# Patient Record
Sex: Male | Born: 1955 | Race: White | Hispanic: Yes | Marital: Married | Smoking: Current every day smoker
Health system: Southern US, Community
[De-identification: ages and names within clinical notes are randomized; demographics above are authoritative.]

## PROBLEM LIST (undated history)

## (undated) DIAGNOSIS — I10 Essential (primary) hypertension: Secondary | ICD-10-CM

## (undated) DIAGNOSIS — N433 Hydrocele, unspecified: Secondary | ICD-10-CM

## (undated) DIAGNOSIS — E785 Hyperlipidemia, unspecified: Secondary | ICD-10-CM

## (undated) DIAGNOSIS — E119 Type 2 diabetes mellitus without complications: Secondary | ICD-10-CM

## (undated) DIAGNOSIS — Z9189 Other specified personal risk factors, not elsewhere classified: Secondary | ICD-10-CM

---

## 2006-05-26 ENCOUNTER — Ambulatory Visit: Payer: Self-pay | Admitting: Internal Medicine

## 2006-05-26 LAB — CONVERTED CEMR LAB
ALT: 35 units/L (ref 0–40)
AST: 22 units/L (ref 0–37)
BUN: 8 mg/dL (ref 6–23)
CO2: 29 meq/L (ref 19–32)
Creatinine, Ser: 0.8 mg/dL (ref 0.4–1.5)
GFR calc Af Amer: 132 mL/min
GFR calc non Af Amer: 109 mL/min
Hgb A1c MFr Bld: 12.5 % — ABNORMAL HIGH (ref 4.6–6.0)
PSA: 0.82 ng/mL (ref 0.10–4.00)
Potassium: 4.3 meq/L (ref 3.5–5.1)
RBC: 5.45 M/uL (ref 4.22–5.81)
Sodium: 132 meq/L — ABNORMAL LOW (ref 135–145)
TSH: 1.2 microintl units/mL (ref 0.35–5.50)
Total Bilirubin: 1.2 mg/dL (ref 0.3–1.2)
WBC: 5.8 10*3/uL (ref 4.5–10.5)

## 2006-06-02 ENCOUNTER — Ambulatory Visit: Payer: Self-pay | Admitting: Internal Medicine

## 2006-06-19 ENCOUNTER — Ambulatory Visit: Payer: Self-pay | Admitting: Internal Medicine

## 2006-07-14 ENCOUNTER — Ambulatory Visit: Payer: Self-pay | Admitting: Internal Medicine

## 2006-07-16 ENCOUNTER — Ambulatory Visit: Payer: Self-pay | Admitting: *Deleted

## 2006-07-30 ENCOUNTER — Ambulatory Visit: Payer: Self-pay | Admitting: Internal Medicine

## 2006-08-14 ENCOUNTER — Ambulatory Visit: Payer: Self-pay | Admitting: Internal Medicine

## 2007-01-21 ENCOUNTER — Telehealth (INDEPENDENT_AMBULATORY_CARE_PROVIDER_SITE_OTHER): Payer: Self-pay | Admitting: *Deleted

## 2007-01-25 ENCOUNTER — Encounter (INDEPENDENT_AMBULATORY_CARE_PROVIDER_SITE_OTHER): Payer: Self-pay | Admitting: Nurse Practitioner

## 2007-01-25 DIAGNOSIS — N476 Balanoposthitis: Secondary | ICD-10-CM

## 2007-01-25 DIAGNOSIS — R809 Proteinuria, unspecified: Secondary | ICD-10-CM | POA: Insufficient documentation

## 2007-01-27 ENCOUNTER — Encounter (INDEPENDENT_AMBULATORY_CARE_PROVIDER_SITE_OTHER): Payer: Self-pay | Admitting: *Deleted

## 2007-01-29 ENCOUNTER — Ambulatory Visit: Payer: Self-pay | Admitting: Internal Medicine

## 2007-01-29 DIAGNOSIS — R3911 Hesitancy of micturition: Secondary | ICD-10-CM | POA: Insufficient documentation

## 2007-01-29 DIAGNOSIS — M25569 Pain in unspecified knee: Secondary | ICD-10-CM | POA: Insufficient documentation

## 2007-01-29 DIAGNOSIS — E119 Type 2 diabetes mellitus without complications: Secondary | ICD-10-CM | POA: Insufficient documentation

## 2007-01-29 DIAGNOSIS — G4700 Insomnia, unspecified: Secondary | ICD-10-CM | POA: Insufficient documentation

## 2007-01-29 LAB — CONVERTED CEMR LAB: Hgb A1c MFr Bld: 5.8 %

## 2007-02-16 ENCOUNTER — Ambulatory Visit: Payer: Self-pay | Admitting: Internal Medicine

## 2007-02-16 DIAGNOSIS — M76899 Other specified enthesopathies of unspecified lower limb, excluding foot: Secondary | ICD-10-CM | POA: Insufficient documentation

## 2007-03-16 ENCOUNTER — Ambulatory Visit: Payer: Self-pay | Admitting: Internal Medicine

## 2007-03-16 ENCOUNTER — Ambulatory Visit (HOSPITAL_COMMUNITY): Admission: RE | Admit: 2007-03-16 | Discharge: 2007-03-16 | Payer: Self-pay | Admitting: Internal Medicine

## 2007-03-16 DIAGNOSIS — IMO0002 Reserved for concepts with insufficient information to code with codable children: Secondary | ICD-10-CM

## 2007-03-16 LAB — CONVERTED CEMR LAB: Blood Glucose, Fingerstick: 118

## 2007-03-19 ENCOUNTER — Telehealth (INDEPENDENT_AMBULATORY_CARE_PROVIDER_SITE_OTHER): Payer: Self-pay | Admitting: Internal Medicine

## 2007-03-21 ENCOUNTER — Encounter (INDEPENDENT_AMBULATORY_CARE_PROVIDER_SITE_OTHER): Payer: Self-pay | Admitting: Internal Medicine

## 2007-03-22 ENCOUNTER — Encounter (INDEPENDENT_AMBULATORY_CARE_PROVIDER_SITE_OTHER): Payer: Self-pay | Admitting: Internal Medicine

## 2007-03-22 ENCOUNTER — Telehealth (INDEPENDENT_AMBULATORY_CARE_PROVIDER_SITE_OTHER): Payer: Self-pay | Admitting: Internal Medicine

## 2007-04-06 ENCOUNTER — Ambulatory Visit: Payer: Self-pay | Admitting: Internal Medicine

## 2007-04-12 ENCOUNTER — Encounter (INDEPENDENT_AMBULATORY_CARE_PROVIDER_SITE_OTHER): Payer: Self-pay | Admitting: Internal Medicine

## 2007-04-13 ENCOUNTER — Encounter: Admission: RE | Admit: 2007-04-13 | Discharge: 2007-04-29 | Payer: Self-pay | Admitting: Internal Medicine

## 2007-04-29 ENCOUNTER — Encounter (INDEPENDENT_AMBULATORY_CARE_PROVIDER_SITE_OTHER): Payer: Self-pay | Admitting: Internal Medicine

## 2007-05-18 ENCOUNTER — Ambulatory Visit: Payer: Self-pay | Admitting: Internal Medicine

## 2007-05-18 DIAGNOSIS — N401 Enlarged prostate with lower urinary tract symptoms: Secondary | ICD-10-CM | POA: Insufficient documentation

## 2007-05-18 DIAGNOSIS — J069 Acute upper respiratory infection, unspecified: Secondary | ICD-10-CM | POA: Insufficient documentation

## 2007-08-10 ENCOUNTER — Ambulatory Visit: Payer: Self-pay | Admitting: Family Medicine

## 2007-08-10 LAB — CONVERTED CEMR LAB: Hgb A1c MFr Bld: 6 %

## 2007-09-16 ENCOUNTER — Ambulatory Visit: Payer: Self-pay | Admitting: Internal Medicine

## 2007-09-16 ENCOUNTER — Encounter (INDEPENDENT_AMBULATORY_CARE_PROVIDER_SITE_OTHER): Payer: Self-pay | Admitting: Internal Medicine

## 2007-09-16 DIAGNOSIS — F528 Other sexual dysfunction not due to a substance or known physiological condition: Secondary | ICD-10-CM | POA: Insufficient documentation

## 2007-09-16 DIAGNOSIS — E785 Hyperlipidemia, unspecified: Secondary | ICD-10-CM | POA: Insufficient documentation

## 2007-09-16 LAB — CONVERTED CEMR LAB: Blood Glucose, Fingerstick: 94

## 2007-11-17 ENCOUNTER — Ambulatory Visit: Payer: Self-pay | Admitting: Nurse Practitioner

## 2007-11-17 DIAGNOSIS — M940 Chondrocostal junction syndrome [Tietze]: Secondary | ICD-10-CM | POA: Insufficient documentation

## 2008-01-10 ENCOUNTER — Telehealth (INDEPENDENT_AMBULATORY_CARE_PROVIDER_SITE_OTHER): Payer: Self-pay | Admitting: Internal Medicine

## 2008-01-19 ENCOUNTER — Ambulatory Visit: Payer: Self-pay | Admitting: Internal Medicine

## 2008-01-19 LAB — CONVERTED CEMR LAB
ALT: 16 units/L (ref 0–53)
Albumin: 4.5 g/dL (ref 3.5–5.2)
Bilirubin, Direct: 0.1 mg/dL (ref 0.0–0.3)
HDL: 41 mg/dL (ref >39)
LDL Cholesterol: 90 mg/dL (ref 0–99)
Total Protein: 7.6 g/dL (ref 6.0–8.3)
Triglycerides: 127 mg/dL (ref <150)

## 2008-02-03 LAB — CONVERTED CEMR LAB
LDL Cholesterol: 86 mg/dL (ref 0–99)
Microalb, Ur: 0.55 mg/dL (ref 0.00–1.89)

## 2008-02-09 ENCOUNTER — Encounter (INDEPENDENT_AMBULATORY_CARE_PROVIDER_SITE_OTHER): Payer: Self-pay | Admitting: *Deleted

## 2008-02-22 ENCOUNTER — Ambulatory Visit: Payer: Self-pay | Admitting: Internal Medicine

## 2008-02-22 LAB — CONVERTED CEMR LAB: Blood Glucose, Fingerstick: 205

## 2008-04-10 ENCOUNTER — Telehealth (INDEPENDENT_AMBULATORY_CARE_PROVIDER_SITE_OTHER): Payer: Self-pay | Admitting: *Deleted

## 2008-09-19 ENCOUNTER — Telehealth (INDEPENDENT_AMBULATORY_CARE_PROVIDER_SITE_OTHER): Payer: Self-pay | Admitting: Internal Medicine

## 2008-10-24 ENCOUNTER — Ambulatory Visit: Payer: Self-pay | Admitting: Internal Medicine

## 2008-10-24 DIAGNOSIS — R930 Abnormal findings on diagnostic imaging of skull and head, not elsewhere classified: Secondary | ICD-10-CM

## 2008-10-24 LAB — CONVERTED CEMR LAB: Hgb A1c MFr Bld: 6.1 %

## 2008-12-07 ENCOUNTER — Ambulatory Visit: Payer: Self-pay | Admitting: Internal Medicine

## 2008-12-15 ENCOUNTER — Ambulatory Visit: Payer: Self-pay | Admitting: Internal Medicine

## 2008-12-21 ENCOUNTER — Ambulatory Visit: Payer: Self-pay | Admitting: Internal Medicine

## 2009-01-03 ENCOUNTER — Encounter (INDEPENDENT_AMBULATORY_CARE_PROVIDER_SITE_OTHER): Payer: Self-pay | Admitting: Internal Medicine

## 2009-02-15 ENCOUNTER — Ambulatory Visit: Payer: Self-pay | Admitting: Internal Medicine

## 2009-03-04 LAB — CONVERTED CEMR LAB
ALT: 21 U/L
AST: 13 U/L
Albumin: 4.1 g/dL
Alkaline Phosphatase: 76 U/L
BUN: 15 mg/dL
CO2: 23 meq/L
Calcium: 8.4 mg/dL
Chloride: 105 meq/L
Cholesterol: 132 mg/dL
Creatinine, Ser: 0.71 mg/dL
Glucose, Bld: 101 mg/dL — ABNORMAL HIGH
HCT: 42.3 %
HDL: 35 mg/dL — ABNORMAL LOW
Hemoglobin: 13.7 g/dL
LDL Cholesterol: 58 mg/dL
MCHC: 32.4 g/dL
MCV: 91.2 fL
Microalb, Ur: 1.16 mg/dL
Platelets: 232 10*3/uL
Potassium: 4.7 meq/L
RBC: 4.64 M/uL
RDW: 13.4 %
Sodium: 139 meq/L
Total Bilirubin: 0.5 mg/dL
Total CHOL/HDL Ratio: 3.8
Total Protein: 6.9 g/dL
Triglycerides: 194 mg/dL — ABNORMAL HIGH
VLDL: 39 mg/dL
WBC: 7.1 10*3/uL

## 2009-03-05 LAB — CONVERTED CEMR LAB
ALT: 18 units/L (ref 0–53)
Alkaline Phosphatase: 75 units/L (ref 39–117)
Cholesterol: 151 mg/dL (ref 0–200)
HDL: 33 mg/dL — ABNORMAL LOW (ref >39)
Indirect Bilirubin: 0.4 mg/dL (ref 0.0–0.9)
Total Bilirubin: 0.5 mg/dL (ref 0.3–1.2)
Triglycerides: 166 mg/dL — ABNORMAL HIGH (ref <150)
VLDL: 33 mg/dL (ref 0–40)

## 2009-05-31 ENCOUNTER — Ambulatory Visit: Payer: Self-pay | Admitting: Internal Medicine

## 2009-05-31 DIAGNOSIS — N529 Male erectile dysfunction, unspecified: Secondary | ICD-10-CM

## 2009-05-31 LAB — CONVERTED CEMR LAB
ALT: 23 units/L (ref 0–53)
AST: 15 units/L (ref 0–37)
Albumin: 4.4 g/dL (ref 3.5–5.2)
Alkaline Phosphatase: 64 units/L (ref 39–117)
Blood Glucose, Fingerstick: 106
CO2: 26 meq/L (ref 19–32)
Calcium: 8.7 mg/dL (ref 8.4–10.5)
Chloride: 104 meq/L (ref 96–112)
Glucose, Bld: 90 mg/dL (ref 70–99)
Microalb, Ur: 1.3 mg/dL (ref 0.00–1.89)
Potassium: 4.1 meq/L (ref 3.5–5.3)
Sodium: 140 meq/L (ref 135–145)
Total Bilirubin: 0.7 mg/dL (ref 0.3–1.2)
Total Protein: 7.2 g/dL (ref 6.0–8.3)

## 2009-06-14 ENCOUNTER — Encounter (INDEPENDENT_AMBULATORY_CARE_PROVIDER_SITE_OTHER): Payer: Self-pay | Admitting: Internal Medicine

## 2009-07-10 ENCOUNTER — Encounter (INDEPENDENT_AMBULATORY_CARE_PROVIDER_SITE_OTHER): Payer: Self-pay | Admitting: Internal Medicine

## 2009-07-30 ENCOUNTER — Ambulatory Visit: Payer: Self-pay | Admitting: Internal Medicine

## 2009-07-30 LAB — CONVERTED CEMR LAB
ALT: 22 units/L (ref 0–53)
BUN: 19 mg/dL (ref 6–23)
Chloride: 107 meq/L (ref 96–112)
Cholesterol: 148 mg/dL (ref 0–200)
Glucose, Bld: 103 mg/dL — ABNORMAL HIGH (ref 70–99)
LDL Cholesterol: 97 mg/dL (ref 0–99)
Potassium: 4.4 meq/L (ref 3.5–5.3)
Total Bilirubin: 0.6 mg/dL (ref 0.3–1.2)
Total CHOL/HDL Ratio: 4.1
Triglycerides: 74 mg/dL (ref <150)

## 2009-08-22 ENCOUNTER — Encounter (INDEPENDENT_AMBULATORY_CARE_PROVIDER_SITE_OTHER): Payer: Self-pay | Admitting: Internal Medicine

## 2009-09-28 ENCOUNTER — Ambulatory Visit: Payer: Self-pay | Admitting: Internal Medicine

## 2009-09-28 LAB — CONVERTED CEMR LAB
Basophils Relative: 1 % (ref 0–1)
Eosinophils Absolute: 0.2 10*3/uL (ref 0.0–0.7)
Glucose, Urine, Semiquant: NEGATIVE
HCT: 41.8 % (ref 39.0–52.0)
Hgb A1c MFr Bld: 5.7 %
Lymphocytes Relative: 31 % (ref 12–46)
Lymphs Abs: 1.7 10*3/uL (ref 0.7–4.0)
Monocytes Relative: 8 % (ref 3–12)
Neutro Abs: 3.2 10*3/uL (ref 1.7–7.7)
Neutrophils Relative %: 58 % (ref 43–77)
Nitrite: NEGATIVE
Specific Gravity, Urine: 1.01
WBC Urine, dipstick: NEGATIVE
WBC: 5.5 10*3/uL (ref 4.0–10.5)

## 2009-10-02 ENCOUNTER — Telehealth (INDEPENDENT_AMBULATORY_CARE_PROVIDER_SITE_OTHER): Payer: Self-pay | Admitting: Internal Medicine

## 2009-10-05 ENCOUNTER — Encounter (INDEPENDENT_AMBULATORY_CARE_PROVIDER_SITE_OTHER): Payer: Self-pay | Admitting: Internal Medicine

## 2009-10-05 LAB — CONVERTED CEMR LAB: Chlamydia, Swab/Urine, PCR: NEGATIVE

## 2009-10-08 ENCOUNTER — Encounter (INDEPENDENT_AMBULATORY_CARE_PROVIDER_SITE_OTHER): Payer: Self-pay | Admitting: Internal Medicine

## 2009-10-12 ENCOUNTER — Ambulatory Visit: Payer: Self-pay | Admitting: Internal Medicine

## 2009-10-18 ENCOUNTER — Telehealth (INDEPENDENT_AMBULATORY_CARE_PROVIDER_SITE_OTHER): Payer: Self-pay | Admitting: Internal Medicine

## 2009-10-24 ENCOUNTER — Ambulatory Visit (HOSPITAL_COMMUNITY): Admission: RE | Admit: 2009-10-24 | Discharge: 2009-10-24 | Payer: Self-pay | Admitting: Internal Medicine

## 2009-10-30 ENCOUNTER — Encounter (INDEPENDENT_AMBULATORY_CARE_PROVIDER_SITE_OTHER): Payer: Self-pay | Admitting: Internal Medicine

## 2009-11-16 ENCOUNTER — Telehealth (INDEPENDENT_AMBULATORY_CARE_PROVIDER_SITE_OTHER): Payer: Self-pay | Admitting: Internal Medicine

## 2009-11-30 ENCOUNTER — Telehealth (INDEPENDENT_AMBULATORY_CARE_PROVIDER_SITE_OTHER): Payer: Self-pay | Admitting: Internal Medicine

## 2009-11-30 ENCOUNTER — Ambulatory Visit: Payer: Self-pay | Admitting: Internal Medicine

## 2009-12-03 ENCOUNTER — Encounter (INDEPENDENT_AMBULATORY_CARE_PROVIDER_SITE_OTHER): Payer: Self-pay | Admitting: Internal Medicine

## 2010-06-02 ENCOUNTER — Encounter: Payer: Self-pay | Admitting: Internal Medicine

## 2010-06-12 NOTE — Letter (Signed)
Summary: *Referral Letter  [facility]  [address] [place], [place] [address]   Phone: [phone]  Fax: [phone]    11/30/2009  Thank you in advance for agreeing to see my patient:  Reginald Abbott [address] [place], [place]  [address]  Phone: [phone]  Reason for Referral: Bilateral epidydmal cysts, the right cyst(s) being the most bothersome secondary to size.  Pt. would like to know if could have removed despite not having much in way of pain.  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.89) 2)  NASAL CONGESTION (ICD-472.0) 3)  HEALTH MAINTENANCE EXAM (ICD-V70.0) 4)  ERECTILE DYSFUNCTION, ORGANIC (ICD-607.84) 5)  ENCOUNTER FOR LONG-TERM USE OF OTHER MEDICATIONS (ICD-V58.69) 6)  NONSPCIFC ABN FINDING RAD & OTH EXAM LUNG FIELD (ICD-793.1) 7)  COSTOCHONDRITIS (ICD-733.6) 8)  DYSLIPIDEMIA (ICD-272.4) 9)  ERECTILE DYSFUNCTION (ICD-302.72) 10)  KNEE PAIN, LEFT (ICD-719.46) 11)  BENIGN PROSTATIC HYPERTROPHY, WITH OBSTRUCTION (ICD-600.01) 12)  URI (ICD-465.9) 13)  LUMBAR RADICULOPATHY, RIGHT (ICD-724.4) 14)  TROCHANTERIC BURSITIS, RIGHT (ICD-726.5) 15)  SYMPTOM, HESITANCY, URINARY (ICD-788.64) 16)  KNEE PAIN, RIGHT (ICD-719.46) 17)  INSOMNIA, CHRONIC (ICD-307.42) 18)  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  Sincerely,  Julieanne Manson MD

## 2010-06-12 NOTE — Progress Notes (Signed)
Summary: No urine   Phone Note From Other Clinic   Summary of Call: Kirsten from [place] lab called they did not recieve a urine for GC chlamydia. Initial call taken by: Levon Hedger,  Oct 02, 2009 9:53 AM  Follow-up for Phone Call        Pt will stop by to give another urine. Follow-up by: Vesta Mixer CMA,  Oct 03, 2009 12:31 PM

## 2010-06-12 NOTE — Progress Notes (Signed)
   Phone Note Call from Patient   Summary of Call: The pt states that the medical assistant gave him an appointment on June 10 for x-rays visit at [facility] but it was June 6 so he lost his appointment.  Pt is wondered if the proider can re-schedule another appointment. Teo Moede MD Initial call taken by: Manon Hilding,  October 18, 2009 12:28 PM  Follow-up for Phone Call        Appt rescheduled for this Wed at 9:30 pt aware. Follow-up by: Vesta Mixer CMA,  October 22, 2009 12:51 PM

## 2010-06-12 NOTE — Letter (Signed)
Summary: *HSN Results Follow up  [facility]  [address] [place], [place] [address]   Phone: [phone]  Fax: [phone]      10/08/2009   AZUL BRUMETT [address] [place], [place]  [address]   Dear  Mr. Kaesyn Brannick,                            ____S.Drinkard,FNP   ____D. Gore,FNP       ____B. McPherson,MD   ____V. Rankins,MD    __X__E. Tyneisha Hegeman,MD    ____N. Daphine Deutscher, FNP  ____D. Reche Dixon, MD    ____K. Philipp Deputy, MD    ____Other     This letter is to inform you that your recent test(s):  _______Pap Smear    ___X____Lab Test     _______X-ray    ____X___ is within acceptable limits  _______ requires a medication change  _______ requires a follow-up lab visit  _______ requires a follow-up visit with your provider   Comments:  Labs including testing for prostate cancer were all okay       _________________________________________________________ If you have any questions, please contact our office                     Sincerely,  Julieanne Manson MD [facility]

## 2010-06-12 NOTE — Assessment & Plan Note (Signed)
Summary: 4-6 MONTH FU FOR CPE///KT   Vital Signs:  Patient profile:   55 year old male Height:      62 inches Weight:      169 pounds BMI:     31.02 Temp:     97.5 degrees F Pulse rate:   57 / minute Pulse rhythm:   regular Resp:     16 per minute BP sitting:   127 / 81  (left arm) Cuff size:   regular  Vitals Entered By: Vesta Mixer CMA (Sep 28, 2009 9:54 AM) CC: CPE Is Patient Diabetic? Yes Pain Assessment Patient in pain? no      CBG Result 89  Does patient need assistance? Ambulation Normal   CC:  CPE.  History of Present Illness: 55 yo male here for CPE  No concerns.  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 Denies surgical history  Family History: Mother, 67:  Healthy Father, 20:  Healthy 2 Brothers:  Healthy 1 Sister:  Hypercholesterolemia 3 sons:  all healthy 3 daughters:  all healthy--Estella is a daughter known to me.  Social History: Married for 30 years Georganna Skeans, occasional lawn jobs Lives at home with wife, grandson and granddaughter.Drug Use:  never  Review of Systems General:  Energy is good. Eyes:  Denies blurring; No glasses. ENT:  Denies decreased hearing. CV:  Denies chest pain or discomfort and palpitations. Resp:  Denies shortness of breath; Nasal congestion--related to using Lovaza, but pt. also  taking Afrin.  Not clear when the congestion started, but seem to think it was during the past winter--started Afrin about 2 months ago--using at least two times a day.  Has had Lovaza prescribed since 2009--not sure if he has been taking all along.Marland Kitchen GI:  Denies abdominal pain, bloody stools, constipation, dark tarry stools, and diarrhea. GU:  Complains of erectile dysfunction; denies discharge, nocturia, urinary frequency, and urinary hesitancy; Viagra did not help ED. MS:  Denies joint pain, joint redness, and joint swelling. Derm:  Denies lesion(s) and rash. Neuro:  Denies numbness, tingling, and weakness. Psych:  Denies anxiety, depression, and suicidal thoughts/plans.  Physical Exam  General:  Well-developed,well-nourished,in no acute distress; alert,appropriate and cooperative throughout examination Head:  Normocephalic and atraumatic without obvious abnormalities. No apparent alopecia or balding. Eyes:  No corneal or conjunctival inflammation noted. EOMI. Perrla. Funduscopic exam benign, without hemorrhages, exudates or papilledema. Vision grossly normal. Ears:  External ear exam shows no significant lesions or deformities.  Otoscopic examination reveals clear canals, tympanic membranes are intact bilaterally without bulging, retraction, inflammation or discharge. Hearing is grossly normal bilaterally. Nose:  External nasal examination shows no deformity or inflammation. Nasal mucosa are pink and moist without lesions or exudates. Mouth:  Oral mucosa and oropharynx without lesions or exudates.  Dentures upper and lower Neck:  No deformities, masses, or tenderness noted. Chest Wall:  No deformities, masses, tenderness or gynecomastia noted. Lungs:  Normal respiratory effort, chest expands symmetrically. Lungs are clear to auscultation, no crackles or wheezes. Heart:  Normal  rate and regular rhythm. S1 and S2 normal without gallop, murmur, click, rub or other extra sounds. Abdomen:  Bowel  sounds positive,abdomen soft and non-tender without masses, organomegaly or hernias noted. Rectal:  Refused Genitalia:  Refused Prostate:  Refused Msk:  No deformity or scoliosis noted of thoracic or lumbar spine.   Pulses:  R and L carotid,radial,femoral,dorsalis pedis and posterior tibial pulses are full and equal bilaterally Extremities:  No clubbing, cyanosis, edema, or deformity noted with normal full range of motion of all joints.   Neurologic:  No cranial nerve deficits noted. Station and gait are normal. Plantar reflexes are down-going bilaterally. DTRs are symmetrical throughout. Sensory, motor and coordinative functions appear intact. Skin:  Intact without suspicious lesions or rashes Cervical Nodes:  No lymphadenopathy noted Axillary Nodes:  No palpable lymphadenopathy Inguinal Nodes:  No significant adenopathy Psych:  Cognition and judgment appear intact. Alert and cooperative with normal attention span and concentration. No apparent delusions, illusions, hallucinations  Diabetes Management Exam:    Foot Exam (with socks and/or shoes not present):       Sensory-Monofilament:          Left foot: normal          Right foot: normal   Impression & Recommendations:  Problem # 1:  HEALTH MAINTENANCE EXAM (ICD-V70.0)  Guaiac cards x 3 and return in 2 weeks. Schedule colonoscopy Encouraged yearly flu vaccine  Orders: T-GC Probe, urine [id number]) T-Chlamydia  Probe, urine [phone]) T-HIV Antibody  (Reflex) [id number]) T-Syphilis Test (RPR) [phone]) T-PSA (28413-24401)  Problem # 2:  DIABETES MELLITUS, TYPE II (ICD-250.00) Well controlled--as into normal range--stop Glimepiride Foot exam today The following medications were removed from the medication list:    Glimepiride 4 Mg Tabs (Glimepiride) .Marland Kitchen... 1/2 tablet by mouth daily for diabetes His updated medication list for this problem includes:    Glucophage 500 Mg Tabs (Metformin hcl) .Marland Kitchen... 1 tablet by mouth  two times a day for diabetes    Lisinopril 5 Mg Tabs (Lisinopril) .Marland Kitchen... 1 tab by mouth daily--filled out own pfizer icp  Orders: Capillary Blood Glucose/CBG (02725) UA Dipstick w/o Micro (manual) (36644) Hgb A1C (03474QV)  Complete Medication List: 1)  Glucophage 500 Mg Tabs (Metformin hcl) .Marland Kitchen.. 1 tablet by mouth two times a day for diabetes 2)  Lisinopril 5 Mg Tabs (Lisinopril) .Marland Kitchen.. 1 tab by mouth daily--filled out own pfizer icp 3)  Naprosyn 500 Mg Tabs (Naproxen) .... Take one tablet by mouth every 12 hours as needed . take with food 4)  Lovaza 1 Gm Caps (Omega-3-acid ethyl esters) .... 4 caps by mouth daily 5)  Lipitor 10 Mg Tabs (Atorvastatin calcium) .... 1/2 tab by mouth daily  Other Orders: T-CBC No Diff ([id number])  Patient Instructions: 1)  Schedule Retasure in August 2)  Follow up with Dr. Delrae Alfred in 6 months --DM 3)  Call if you notice your sugars getting higher after stopping Glimepiride   Preventive Care Screening  Prior Values:    PSA:  0.82 (05/26/2006)    Last Tetanus Booster:  Historical (06/12/2006)    Last Flu Shot:  Fluvax 3+ (03/16/2007)    Last Pneumovax:  Historical (06/12/2006)     Flu vaccine:  Thinks he received a flu shot this past year--cannot recall. STE:  Yes--no changes Guaiac Cards:  has never returned Colonoscopy:  never  Laboratory Results   Urine Tests    Routine Urinalysis   Glucose: negative   (Normal Range: Negative) Bilirubin: negative   (Normal Range:  Negative) Ketone: negative   (Normal Range: Negative) Spec. Gravity: 1.010   (Normal Range: 1.003-1.035) Blood: negative   (Normal Range: Negative) pH: 6.5   (Normal Range: 5.0-8.0) Protein: negative   (Normal Range: Negative) Urobilinogen: 0.2   (Normal Range: 0-1) Nitrite: negative   (Normal Range: Negative) Leukocyte Esterace: negative   (Normal Range: Negative)     Blood Tests     HGBA1C: 5.7%   (Normal Range: Non-Diabetic - 3-6%   Control Diabetic - 6-8%) CBG  Random:: 89mg /dL      Last LDL:                                                 97 (07/30/2009 10:00:00 PM)        Diabetic Foot Exam    10-g (5.07) Semmes-Weinstein Monofilament Test Performed by: Vesta Mixer CMA          Right Foot          Left Foot Visual Inspection               Test Control      normal         normal Site 1         normal         normal Site 2         normal         normal Site 3         normal         normal Site 4         normal         normal Site 5         normal         normal Site 6         normal         normal Site 7         normal         normal Site 8         normal         normal Site 9         normal         normal Site 10         normal         normal  Impression      normal         normal   Appended Document: 4-6 MONTH FU FOR CPE///KT     Allergies: No Known Drug Allergies   Impression & Recommendations:  Problem # 1:  NASAL CONGESTION (ICD-472.0) Hold Lovaza and stop generic Afrin--see if nasal congestion resolves. If not, to call and let me know and will start nasal corticosteroid and restart Lovaza  Complete Medication List: 1)  Glucophage 500 Mg Tabs (Metformin hcl) .Marland Kitchen.. 1 tablet by mouth two times a day for diabetes 2)  Lisinopril 5 Mg Tabs (Lisinopril) .Marland Kitchen.. 1 tab by mouth daily--filled out own pfizer icp 3)  Naprosyn 500 Mg Tabs (Naproxen) .... Take one tablet by mouth every 12 hours as needed . take with food 4)  Lovaza 1 Gm Caps (Omega-3-acid ethyl esters) .... 4 caps by mouth daily 5)  Lipitor 10 Mg Tabs (Atorvastatin calcium) .... 1/2 tab by mouth daily

## 2010-06-12 NOTE — Assessment & Plan Note (Signed)
Summary: discuss medications//gk   Vital Signs:  Patient profile:   55 year old male Weight:      165 pounds Temp:     97.4 degrees F Pulse rate:   89 / minute Pulse rhythm:   regular Resp:     16 per minute BP sitting:   121 / 78  (left arm) Cuff size:   regular  Vitals Entered By: Vesta Mixer CMA (November 30, 2009 3:13 PM) CC: needs refill on his meds Is Patient Diabetic? Yes CBG Result 228  Does patient need assistance? Ambulation Normal   CC:  needs refill on his meds.  History of Present Illness: 1.  Epidydimal Cyst:  pt. did not realize he did not have an infection of his testicle--apparently the letter sent was misinterpreted.  He is here to find out what he can do for it as the largest cyst on the right is uncomfortable to him--just in the way, not really painful.  States just the right cyst really bothers him--apparently there are multiple cysts, the largest diameter on the right is 3.6 cm.    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 would like to see about whether he can have at least the right cyst removed as it is bothersome.  Orders: Urology Referral (Urology)  Complete Medication List: 1)  Glucophage 500 Mg Tabs (Metformin hcl) .Marland Kitchen.. 1 tablet by mouth two times a day for diabetes 2)  Lisinopril 5 Mg Tabs (Lisinopril) .Marland Kitchen.. 1 tab by mouth daily--filled out own pfizer icp 3)  Naprosyn 500 Mg Tabs (Naproxen) .... Take one tablet by mouth every 12 hours as needed . take with food 4)  Lovaza 1 Gm Caps (Omega-3-acid ethyl esters) .... 4 caps by mouth daily 5)  Lipitor 10 Mg Tabs (Atorvastatin calcium) .... 1/2 tab by mouth daily 6)  Fluticasone Propionate 50 Mcg/act Susp (Fluticasone propionate) .... 2 sprays each nostril daily  Other Orders: Capillary Blood Glucose/CBG (07371)  Patient Instructions: 1)  Candi Leash or Cheryll Dessert will be calling you with the  referral to [facility]. Prescriptions: FLUTICASONE PROPIONATE 50 MCG/ACT SUSP (FLUTICASONE PROPIONATE) 2 sprays each nostril daily  #1 x 11   Entered and Authorized by:   Julieanne Manson MD   Signed by:   Julieanne Manson MD on 11/30/2009   Method used:   Faxed to ...       [facility] (retail)       [address] [place], [place]  [address]       Ph: [phone]       Fax: [phone]   RxID:   [id number]

## 2010-06-12 NOTE — Letter (Signed)
Summary: ENROLLMENT FORM/CONNECTION TO CARE  ENROLLMENT FORM/CONNECTION TO CARE   Imported By: Arta Bruce 09/13/2009 15:35:21  _____________________________________________________________________  External Attachment:    Type:   Image     Comment:   External Document

## 2010-06-12 NOTE — Progress Notes (Signed)
Summary: Office Visit DEPRESSION SCREENING  Office Visit DEPRESSION SCREENING   Imported By: Arta Bruce 11/19/2009 11:22:29  _____________________________________________________________________  External Attachment:    Type:   Image     Comment:   External Document

## 2010-06-12 NOTE — Assessment & Plan Note (Signed)
Summary: 63mo F/U(RESCHEDULED)///RJP   Vital Signs:  Patient profile:   55 year old male Weight:      173 pounds Temp:     97.7 degrees F Pulse rate:   61 / minute Pulse rhythm:   regular Resp:     18 per minute BP sitting:   128 / 80  (left arm) Cuff size:   regular  Vitals Entered By: Vesta Mixer CMA (May 31, 2009 9:10 AM) CC: f/u diabetes/ htn Is Patient Diabetic? Yes Pain Assessment Patient in pain? no      CBG Result 106  Does patient need assistance? Ambulation Normal   CC:  f/u diabetes/ htn.  History of Present Illness: 1.  DM:  Sugars have been well controlled.  A1C today quite good as well.  Had Retasure last spring or summer.  Just received flu shot today.  Other immunizations up to date.  No numbness or tingling in hands or feet.  Does intermittently have what he describes as small muscle spasms here and there---sounds like after working, especially in hot environment.  Tries to drink water, but also drinking caffeinated beverages.  2.  Dyslipidemia:  was not quite at goal in October.  Pt., however, appears to not be filling Lipitor since at least August.  He thought he was supposed to stop as did not receive from pharmacy.  Is taking Lovaza.  3.  Microalbuminuria:  continues on Lisinopril.    4.  ED:  Viagra has not helped, even 100 mg.  Would be interested in seeing Urology at [facility].  5.  BPH:  no longer with symptoms--has not been taking Proscar or Flomax for some time.   Allergies (verified): No Known Drug Allergies  Physical Exam  Lungs:  Normal respiratory effort, chest expands symmetrically. Lungs are clear to auscultation, no crackles or wheezes. Heart:  Normal rate and regular rhythm. S1 and S2 normal without gallop, murmur, click, rub or other extra sounds.  No carotid bruits, carotid, radial pulses normal and equal. Abdomen:  Bowel sounds positive,abdomen soft and non-tender without masses, organomegaly or hernias noted.  Diabetes  Management Exam:    Foot Exam (with socks and/or shoes not present):       Sensory-Monofilament:          Left foot: normal          Right foot: normal   Impression & Recommendations:  Problem # 1:  DIABETES MELLITUS, TYPE II (ICD-250.00) Well controlled Check on Retasure His updated medication list for this problem includes:    Glucophage 500 Mg Tabs (Metformin hcl) .Marland Kitchen... 1 tablet by mouth two times a day for diabetes    Glimepiride 4 Mg Tabs (Glimepiride) .Marland Kitchen... 1/2 tablet by mouth daily for diabetes    Lisinopril 5 Mg Tabs (Lisinopril) .Marland Kitchen... 1 tab by mouth daily  Orders: Capillary Blood Glucose/CBG (82948) Hgb A1C (04540JW)  Problem # 2:  ERECTILE DYSFUNCTION (ICD-302.72) Referral to [facility] Urology The following medications were removed from the medication list:    Viagra 50 Mg Tabs (Sildenafil citrate) .Marland Kitchen... 2 tabs by mouth 1/2 hour before activity.  max of 2 in 24 hour period  Problem # 3:  DYSLIPIDEMIA (ICD-272.4) Restart Lipitor His updated medication list for this problem includes:    Lovaza 1 Gm Caps (Omega-3-acid ethyl esters) .Marland KitchenMarland KitchenMarland KitchenMarland Kitchen 4 caps by mouth daily    Lipitor 10 Mg Tabs (Atorvastatin calcium) .Marland Kitchen... 1/2 tab by mouth daily  Problem # 4:  MICROALBUMINURIA (ICD-791.0)  Orders:  T-Urine Microalbumin w/creat. ratio [id number])  Complete Medication List: 1)  Glucophage 500 Mg Tabs (Metformin hcl) .Marland Kitchen.. 1 tablet by mouth two times a day for diabetes 2)  Glimepiride 4 Mg Tabs (Glimepiride) .... 1/2 tablet by mouth daily for diabetes 3)  Lisinopril 5 Mg Tabs (Lisinopril) .Marland Kitchen.. 1 tab by mouth daily 4)  Naprosyn 500 Mg Tabs (Naproxen) .... Take one tablet by mouth every 12 hours as needed . take with food 5)  Lovaza 1 Gm Caps (Omega-3-acid ethyl esters) .... 4 caps by mouth daily 6)  Lipitor 10 Mg Tabs (Atorvastatin calcium) .... 1/2 tab by mouth daily  Other Orders: T-Comprehensive Metabolic Panel [id number]) Urology Referral (Urology)  Patient  Instructions: 1)  Fasting lab visit in 2 months:  CMET, FLP 2)  CPE with Dr. Delrae Alfred in next 4-6 months Prescriptions: LIPITOR 10 MG TABS (ATORVASTATIN CALCIUM) 1/2 tab by mouth daily  #15 x 6   Entered and Authorized by:   Julieanne Manson MD   Signed by:   Julieanne Manson MD on 05/31/2009   Method used:   Faxed to ...       [facility] (retail)       [address] [place], [place]  [address]       Ph: [phone]       Fax: [phone]   RxID:   [id number] LOVAZA 1 GM  CAPS (OMEGA-3-ACID ETHYL ESTERS) 4 caps by mouth daily  #120 x 11   Entered and Authorized by:   Julieanne Manson MD   Signed by:   Julieanne Manson MD on 05/31/2009   Method used:   Faxed to ...       [facility] - [facility] (retail)       [address] [place], [place]  [address]       Ph: [phone]       Fax: [phone]   RxID:   [id number] NAPROSYN 500 MG TABS (NAPROXEN) Take one tablet by mouth every 12 hours as needed . Take with food  #50 Each x 6   Entered and Authorized by:   Julieanne Manson MD   Signed by:   Julieanne Manson MD on 05/31/2009   Method used:   Faxed to ...       [facility] - [facility] (retail)       [address] [place], [place]  [address]       Ph: [phone]       Fax: [phone]   RxID:   [id number] LISINOPRIL 5 MG  TABS (LISINOPRIL) 1 tab by mouth daily  #30 Each x 11   Entered and Authorized by:   Julieanne Manson MD   Signed by:   Julieanne Manson MD on 05/31/2009   Method used:   Faxed to ...       [facility] - [facility] (retail)       [address] [place], [place]  [address]       Ph: [phone]       Fax: [phone]   RxID:   [id number] GLIMEPIRIDE 4 MG  TABS (GLIMEPIRIDE) 1/2 tablet by mouth daily for diabetes  #30 Each x 11   Entered and Authorized by:   Julieanne Manson MD   Signed by:   Julieanne Manson MD on 05/31/2009  Method used:   Faxed to ...       [facility] - [facility] (retail)       [address] [place], [place]  [address]       Ph: [phone]       Fax: [phone]   RxID:   [id number] GLUCOPHAGE 500 MG  TABS (METFORMIN HCL) 1 tablet by mouth two times a day for diabetes  #60 Each x 11   Entered and Authorized by:   Julieanne Manson MD   Signed by:   Julieanne Manson MD on 05/31/2009   Method used:   Faxed to ...       [facility] - [facility] (retail)       [address] [place], [place]  [address]       Ph: [phone]       Fax: [phone]   RxID:   [id number]     Vital Signs:  Patient profile:   55 year old male Weight:      173 pounds Temp:     97.7 degrees F Pulse rate:   61 / minute Pulse rhythm:   regular Resp:     18 per minute BP sitting:   128 / 80  (left arm) Cuff size:   regular  Vitals Entered By: Vesta Mixer CMA (May 31, 2009 9:10 AM)   Laboratory Results   Blood Tests     HGBA1C: 6.0%   (Normal Range: Non-Diabetic - 3-6%   Control Diabetic - 6-8%) CBG Random:: 106mg /dL       Last LDL:                                                 85 (02/15/2009 10:42:00 PM)        Diabetic Foot Exam  Set Next Diabetic Foot Exam here: 05/31/2010   10-g (5.07) Semmes-Weinstein Monofilament Test Performed by: Vesta Mixer CMA          Right Foot          Left Foot Visual Inspection               Test Control      normal         normal Site 1         normal         normal Site 2         normal         normal Site 3         normal         normal Site 4         normal         normal Site 5         normal         normal Site 6         normal         normal Site 7         normal         normal Site 8         normal         normal Site 9         normal  normal Site 10         normal          normal  Impression      normal         normal

## 2010-06-12 NOTE — Letter (Signed)
Summary: *HSN Results Follow up  [facility]  [address] [place], [place] [address]   Phone: [phone]  Fax: [phone]      10/30/2009   [name] [address] [place], [place]  [address]   Dear  Mr. Reginald Abbott,                            ____S.Drinkard,FNP   ____D. Gore,FNP       ____B. McPherson,MD   ____V. Rankins,MD    __X__E. Othal Kubitz,MD    ____N. Daphine Deutscher, FNP  ____D. Reche Dixon, MD    ____K. Philipp Deputy, MD    ____Other     This letter is to inform you that your recent test(s):  _______Pap Smear    ___X____Lab Test     ____X___X-ray    ___X____ is within acceptable limits  _______ requires a medication change  _______ requires a follow-up lab visit  _______ requires a follow-up visit with your provider   Comments:  Lab tests showed no infection involving your testicles.  The swelling is from the beginning of your sperm duct and is not anything to worry about unless it is causing you pain.  It is NOT cancer.       _________________________________________________________ If you have any questions, please contact our office                     Sincerely,  Julieanne Manson MD [facility]

## 2010-06-12 NOTE — Progress Notes (Signed)
Summary: Urology referral   Phone Note Outgoing Call   Summary of Call: Debra--referral to Urology--assuming at [facility] want more specifics about charges and how to get payment plan set up than I can give them. Initial call taken by: Julieanne Manson MD,  November 30, 2009 3:56 PM  Follow-up for Phone Call        Arna Medici can you work on this one? Follow-up by: Candi Leash,  December 03, 2009 11:49 AM  Additional Follow-up for Phone Call Additional follow up Details #1::        PT HAVE AN APPT 02-06-10 @ 1 PM  [facility] PH [phone]. PT AWARE OF THE APPT  Additional Follow-up by: Cheryll Dessert,  December 03, 2009 4:29 PM

## 2010-06-12 NOTE — Assessment & Plan Note (Signed)
Summary: pt feeling sick//gk   Vital Signs:  Patient profile:   55 year old male Weight:      164 pounds Temp:     98.0 degrees F Pulse rate:   60 / minute Pulse rhythm:   regular Resp:     18 per minute BP sitting:   115 / 75  (left arm) Cuff size:   regular  Vitals Entered By: Vesta Mixer CMA (October 12, 2009 11:06 AM) CC: stuffy head/sinus x 2 months   FYI I called [facility] his gc/chl is pending we should have results either today or tomorrow. Is Patient Diabetic? No Pain Assessment Patient in pain? no       Does patient need assistance? Ambulation Normal   CC:  stuffy head/sinus x 2 months   FYI I called [facility] his gc/chl is pending we should have results either today or tomorrow..  History of Present Illness: 1.  Congestion:  eyes without itching or redness.  Nasal congestion--was using Afrin earlier, but I had told him to stop.  Needs something to get rid of nasal congestion.  Really, no nasal discharge.  No sneezing.   No itching of nose or throat.  2.  After CPE, when pt. did not allow genital or prostate exam, noted one testicle to be small and the other larger.  Feels the right testicle has enlarged.  Nontender.  No penile discharge.    Allergies (verified): No Known Drug Allergies  Physical Exam  Head:  NT over sinuses Nose:  nasal mucose, particularly on left, swollen and erythematous with clear discharge.   Mouth:  pharynx pink and moist.   Genitalia:  Right testicular size quite enlarged.  Not able to reduce the size or transilluminate.  NT.  Unable to feel an open inguinal ring.     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 recommended to perform STE regularly at CPE.  Pt. did not allow exam at CPE Orders: Ultrasound (Ultrasound)  Problem # 2:  NASAL CONGESTION (ICD-472.0) No Afrin Start  Fluticasone nasal  Complete Medication List: 1)  Glucophage 500 Mg Tabs (Metformin hcl) .Marland Kitchen.. 1 tablet by mouth two times a day for diabetes 2)  Lisinopril 5 Mg Tabs (Lisinopril) .Marland Kitchen.. 1 tab by mouth daily--filled out own pfizer icp 3)  Naprosyn 500 Mg Tabs (Naproxen) .... Take one tablet by mouth every 12 hours as needed . take with food 4)  Lovaza 1 Gm Caps (Omega-3-acid ethyl esters) .... 4 caps by mouth daily 5)  Lipitor 10 Mg Tabs (Atorvastatin calcium) .... 1/2 tab by mouth daily 6)  Fluticasone Propionate 50 Mcg/act Susp (Fluticasone propionate) .... 2 sprays each nostril daily  Patient Instructions: 1)  Follow up in about 5 months--you should already have that appt--if not, please make Prescriptions: FLUTICASONE PROPIONATE 50 MCG/ACT SUSP (FLUTICASONE PROPIONATE) 2 sprays each nostril daily  #1 x 11   Entered and Authorized by:   Julieanne Manson MD   Signed by:   Julieanne Manson MD on 10/12/2009   Method used:   Faxed to ...       [facility] - [facility] (retail)       [address] [place], [place]  [address]       Ph: [phone]  Fax: [phone]   RxID:   [id number]   Appended Document: Hemoccult results  Laboratory Results    Stool - Occult Blood Hemmoccult #1: negative Date: 10/26/2009 Hemoccult #2: negative Date: 10/26/2009 Hemoccult #3: negative Date: 10/26/2009

## 2010-06-12 NOTE — Letter (Signed)
Summary: REFERRAL/UROLOGY//APPT DATE & TIME  REFERRAL/UROLOGY//APPT DATE & TIME   Imported By: Arta Bruce 12/04/2009 10:41:42  _____________________________________________________________________  External Attachment:    Type:   Image     Comment:   External Document

## 2010-06-12 NOTE — Letter (Signed)
Summary: *HSN Results Follow up  [facility]  [address] [place], [place] [address]   Phone: [phone]  Fax: [phone]      06/14/2009   KENTRELL HALLAHAN [address] [place], [place]  [address]   Dear  Mr. Willam Crepeau,                            ____S.Drinkard,FNP   ____D. Gore,FNP       ____B. McPherson,MD   ____V. Rankins,MD    _X___E. Weda Baumgarner,MD    ____N. Daphine Deutscher, FNP  ____D. Reche Dixon, MD    ____K. Philipp Deputy, MD    ____Other     This letter is to inform you that your recent test(s):  _______Pap Smear    ___X____Lab Test     _______X-ray    ___X____ is within acceptable limits  _______ requires a medication change  _______ requires a follow-up lab visit  _______ requires a follow-up visit with your provider   Comments:  Your kidney function, blood salts, liver enzymes and urine testing were all okay       _________________________________________________________ If you have any questions, please contact our office                     Sincerely,  Julieanne Manson MD [facility]

## 2010-06-12 NOTE — Letter (Signed)
Summary: TEST ORDER FROM//[facility]//APPT DATE & TIME  TEST ORDER FROM//[facility]//APPT DATE & TIME   Imported By: Arta Bruce 10/23/2009 12:46:09  _____________________________________________________________________  External Attachment:    Type:   Image     Comment:   External Document

## 2010-06-12 NOTE — Letter (Signed)
Summary: *Referral Letter  [facility]  [address] [place], [place] [address]   Phone: [phone]  Fax: [phone]    05/31/2009  Thank you in advance for agreeing to see my patient:  Reginald Abbott [address] [place], [place]  [address]  Phone: [phone]  Reason for Referral: ED unresponsive to Viagra.  Multiple risk factors  Procedures Requested: Evaluation and recommendations.  Unable to get any other medications for this patient  Current Medical Problems: 1)  ERECTILE DYSFUNCTION, ORGANIC (ICD-607.84) 2)  ENCOUNTER FOR LONG-TERM USE OF OTHER MEDICATIONS (ICD-V58.69) 3)  NONSPCIFC ABN FINDING RAD & OTH EXAM LUNG FIELD (ICD-793.1) 4)  COSTOCHONDRITIS (ICD-733.6) 5)  DYSLIPIDEMIA (ICD-272.4) 6)  ERECTILE DYSFUNCTION (ICD-302.72) 7)  KNEE PAIN, LEFT (ICD-719.46) 8)  BENIGN PROSTATIC HYPERTROPHY, WITH OBSTRUCTION (ICD-600.01) 9)  URI (ICD-465.9) 10)  LUMBAR RADICULOPATHY, RIGHT (ICD-724.4) 11)  TROCHANTERIC BURSITIS, RIGHT (ICD-726.5) 12)  SYMPTOM, HESITANCY, URINARY (ICD-788.64) 13)  KNEE PAIN, RIGHT (ICD-719.46) 14)  INSOMNIA, CHRONIC (ICD-307.42) 15)  MICROALBUMINURIA (ICD-791.0) 16)  BALANITIS (ICD-607.1) 17)  DIABETES MELLITUS, TYPE II (ICD-250.00)   Current Medications: 1)  GLUCOPHAGE 500 MG  TABS (METFORMIN HCL) 1 tablet by mouth two times a day for diabetes 2)  GLIMEPIRIDE 4 MG  TABS (GLIMEPIRIDE) 1/2 tablet by mouth daily for diabetes 3)  LISINOPRIL 5 MG  TABS (LISINOPRIL) 1 tab by mouth daily 4)  NAPROSYN 500 MG TABS (NAPROXEN) Take one tablet by mouth every 12 hours as needed . Take with food 5)  LOVAZA 1 GM  CAPS (OMEGA-3-ACID ETHYL ESTERS) 4 caps by mouth daily 6)  LIPITOR 10 MG TABS (ATORVASTATIN CALCIUM) 1/2 tab by mouth daily   Past Medical History: 1)  Current Problems:  2)  BENIGN PROSTATIC HYPERTROPHY, WITH OBSTRUCTION (ICD-600.01) 3)  LUMBAR RADICULOPATHY, RIGHT (ICD-724.4) 4)  TROCHANTERIC BURSITIS, RIGHT  (ICD-726.5) 5)  SYMPTOM, HESITANCY, URINARY (ICD-788.64) 6)  KNEE PAIN, RIGHT (ICD-719.46) 7)  INSOMNIA, CHRONIC (ICD-307.42) 8)  MICROALBUMINURIA (ICD-791.0) 9)  BALANITIS (ICD-607.1) 10)  DIABETES MELLITUS, TYPE II (ICD-250.00)   Prior History of Blood Transfusions:   Pertinent Labs:    Thank you again for agreeing to see our patient; please contact us if you have any further questions or need additional information.  Sincerely,  Julieanne Manson MD

## 2010-06-12 NOTE — Progress Notes (Signed)
Summary: refill   Phone Note Refill Request   Refills Requested: Medication #1:  GLUCOPHAGE 500 MG  TABS 1 tablet by mouth two times a day for diabetes Summary of Call: The pt states that he needs one extra medication refills for his dm.  [facility] [address] [phone]) Delrae Alfred MD Initial call taken by: Manon Hilding,  November 16, 2009 3:52 PM  Follow-up for Phone Call        Fwd to Dr. Delrae Alfred for refill Berkley Harvey. Follow-up by: Vesta Mixer CMA,  November 16, 2009 5:06 PM    Prescriptions: GLUCOPHAGE 500 MG  TABS (METFORMIN HCL) 1 tablet by mouth two times a day for diabetes  #60 Each x 11   Entered and Authorized by:   Julieanne Manson MD   Signed by:   Julieanne Manson MD on 11/16/2009   Method used:   Electronically to        [facility] [id number]* (retail)       [address]       [place], [place]  [address]       Ph: [phone]       Fax: [phone]   RxID:   [id number]

## 2010-06-12 NOTE — Letter (Signed)
Summary: Lipid Letter  [facility]  [address] [place], [place] [address]   Phone: [phone]  Fax: [phone]    08/22/2009  [facility] [address] [place], [place]  [address]  Dear Leroy Kennedy:  We have carefully reviewed your last lipid profile from 07/30/2009 and the results are noted below with a summary of recommendations for lipid management.    Cholesterol:       148     Goal: <200   HDL "good" Cholesterol:   36     Goal: >45   LDL "bad" Cholesterol:   97     Goal: <70   Triglycerides:       74     Goal: <150    Parts of your cholesterol are very good.  Would like to see your HDL higher and LDL a bit lower.  Are you taking the Lipitor and Lovaza?  Let me know.  Otherwise, would like you to work on diet and exercise and make sure you make an appt. to recheck in 4-6 months    TLC Diet (Therapeutic Lifestyle Change): Saturated Fats & Transfatty acids should be kept < 7% of total calories ***Reduce Saturated Fats Polyunstaurated Fat can be up to 10% of total calories Monounsaturated Fat Fat can be up to 20% of total calories Total Fat should be no greater than 25-35% of total calories Carbohydrates should be 50-60% of total calories Protein should be approximately 15% of total calories Fiber should be at least 20-30 grams a day ***Increased fiber may help lower LDL Total Cholesterol should be < 200mg /day Consider adding plant stanol/sterols to diet (example: Benacol spread) ***A higher intake of unsaturated fat may reduce Triglycerides and Increase HDL    Adjunctive Measures (may lower LIPIDS and reduce risk of Heart Attack) include: Aerobic Exercise (20-30 minutes 3-4 times a week) Limit Alcohol Consumption Weight Reduction Aspirin 75-81 mg a day by mouth (if not allergic or contraindicated) Dietary Fiber 20-30 grams a day by mouth     Current Medications: 1)    Glucophage 500 Mg  Tabs (Metformin hcl) .Marland Kitchen.. 1 tablet by mouth two times a  day for diabetes 2)    Glimepiride 4 Mg  Tabs (Glimepiride) .... 1/2 tablet by mouth daily for diabetes 3)    Lisinopril 5 Mg  Tabs (Lisinopril) .Marland Kitchen.. 1 tab by mouth daily--filled out own pfizer icp 4)    Naprosyn 500 Mg Tabs (Naproxen) .... Take one tablet by mouth every 12 hours as needed . take with food 5)    Lovaza 1 Gm  Caps (Omega-3-acid ethyl esters) .... 4 caps by mouth daily 6)    Lipitor 10 Mg Tabs (Atorvastatin calcium) .... 1/2 tab by mouth daily  If you have any questions, please call. We appreciate being able to work with you.   Sincerely,    [facility] Julieanne Manson MD

## 2010-09-27 NOTE — Assessment & Plan Note (Signed)
[facility]                        [place] OFFICE NOTE   Reginald Abbott, Reginald Abbott                     MRN:          [id number]  DATE:05/26/2006                            DOB:          13-Oct-1955    CHIEF COMPLAINT:  Sugar in the urine.   HISTORY OF PRESENT ILLNESS:  Reginald Abbott is a 55 year old Hispanic male  who recently went to see one of the urologists in town, Dr. Wanda Plump,  for evaluation of a possible circumcision. In preparation to that  surgery, his urine was checked, and it showed sugar on it and because of  that he was asked to come see [place] to rule out diabetes. In general, the  patient feels well and has no complaints.   PAST MEDICAL HISTORY:  Denies any medical problems before or surgeries.   FAMILY HISTORY:  1. Denies any colon or prostate cancer. No coronary artery disease or      heart attacks. No hypertension.  2. A couple of uncles of have diabetes.   SOCIAL HISTORY:  He quit tobacco many years ago. He was never a heavy  smoker. He does not drink alcohol since 2002. He is married and has 6  kids.   REVIEW OF SYSTEMS:  He denies any chest pain, shortness of breath,  nausea or vomiting. No dizziness. His appetite is normal and does not  have any polyuria or polydipsia.   MEDICATIONS:  None at present.   ALLERGIES:  No known drug allergies.   PHYSICAL EXAMINATION:  The patient is alert, oriented and in no apparent  distress. He weighs 170 pounds. Pulse is 80, respirations 16, blood  pressure 150/80.  NECK: No thyromegaly.  LUNGS:  Clear to auscultation bilaterally.  CARDIOVASCULAR: Regular rate and rhythm without murmur.  ABDOMEN: Is not distended, soft, good bowel sounds. No organomegaly.  EXTREMITIES: No edema.   LABORATORY AND X-RAYS:  Urinalysis from Dr. Wanda Plump office shows  positive for glucose, but is otherwise negative. Blood sugar check here  was 475.   ASSESSMENT/PLAN:  1. The patient is diagnosed  today with asymptomatic diabetes. The      onset is unclear.  2. I discussed with him in Spanish the need of to low sugar diet,      exercise routinely. I also explained to him what diabetes is. I am      going to start him on Amaryl 4 mg half per mouth daily. A      prescription for 30 and no refill was given. I discussed with him      the symptoms of low blood sugar as well. He is to take orange juice      should any of those symptoms occur.  3. He was given a free Glucometer today and I explained to him how to      use it.  4. He understands the need for lifelong treatment for diabetes.  5. Will check hemoglobin A1c, comprehensive panel, CBC, TSH and also      PSA as required by urology.  6. He is to check his sugar once a day in  the morning fasting. Write      those numbers down and bring them back for the next office visit.  7. Office visit in 1 week, call if there is any question or problems.     Willow Ora, MD  Electronically Signed    JP/MedQ  DD: 05/26/2006  DT: 05/26/2006  Job #: [id number]   cc:   [employer], M.D.

## 2011-10-16 IMAGING — US US SCROTUM
1 series · 14 of 25 positions shown · non-contrast
Comparison: None.

CLINICAL DATA: Right scrotal mass

ULTRASOUND OF SCROTUM
TECHNIQUE: Complete ultrasound examination of the testicles,
epididymis, and other scrotal structures was performed.

[Series 1: us scrotum · 0.09mm/px · 14 of 64 slices shown]
[im 1/64]
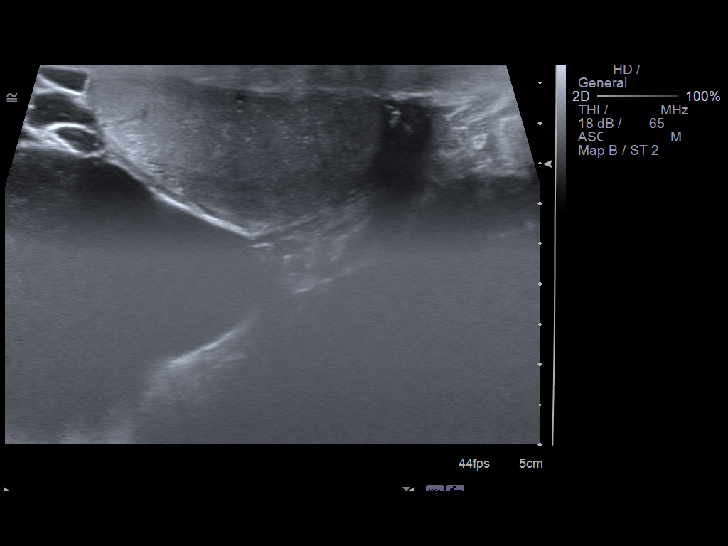
[im 6/64]
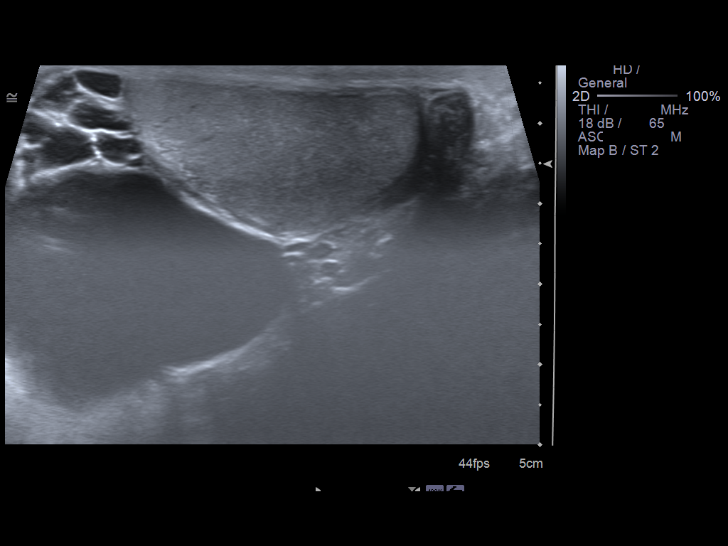
[im 11/64]
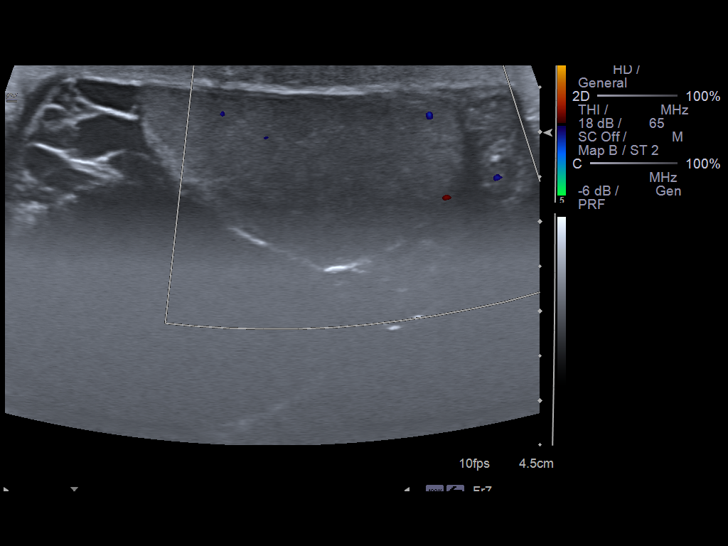
[im 16/64]
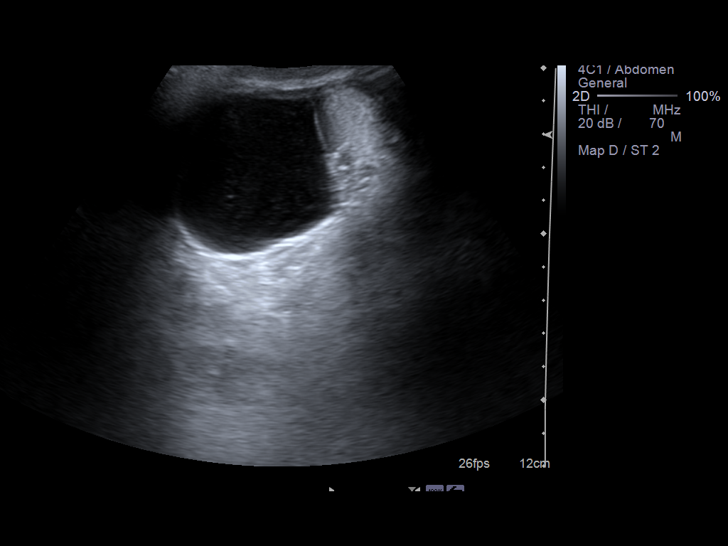
[im 22/64]
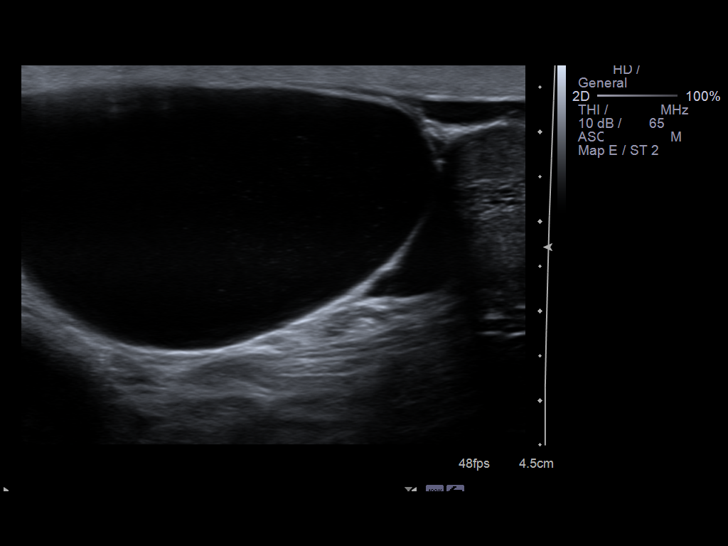
[im 24/64]
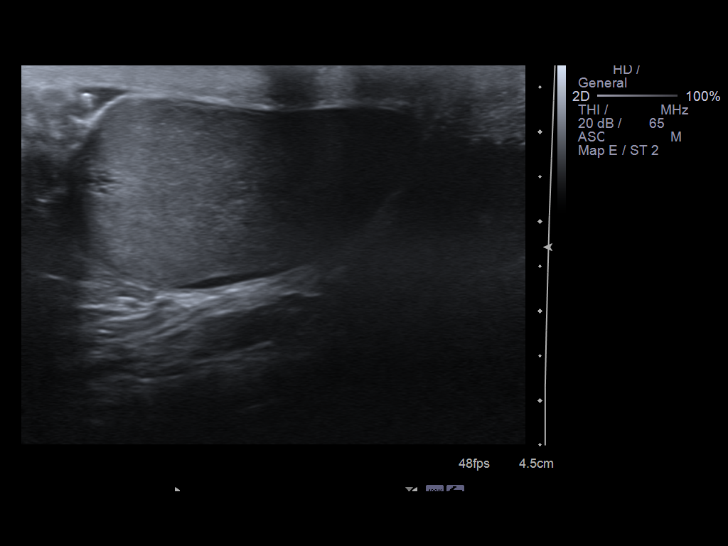
[im 29/64]
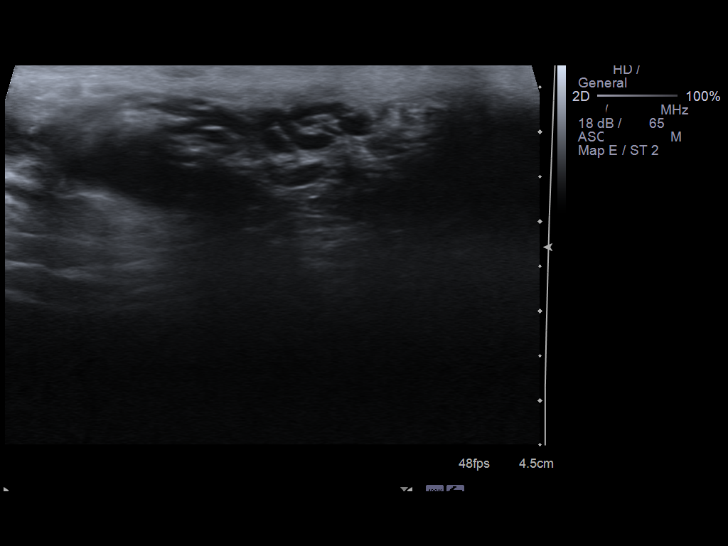
[im 35/64]
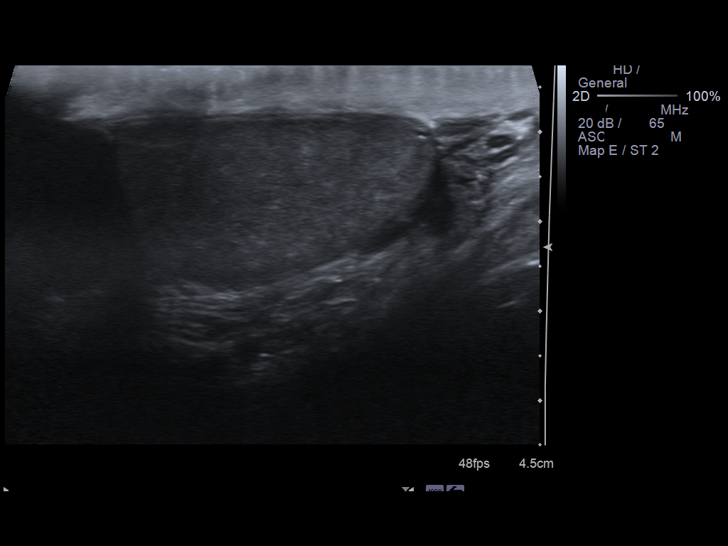
[im 40/64]
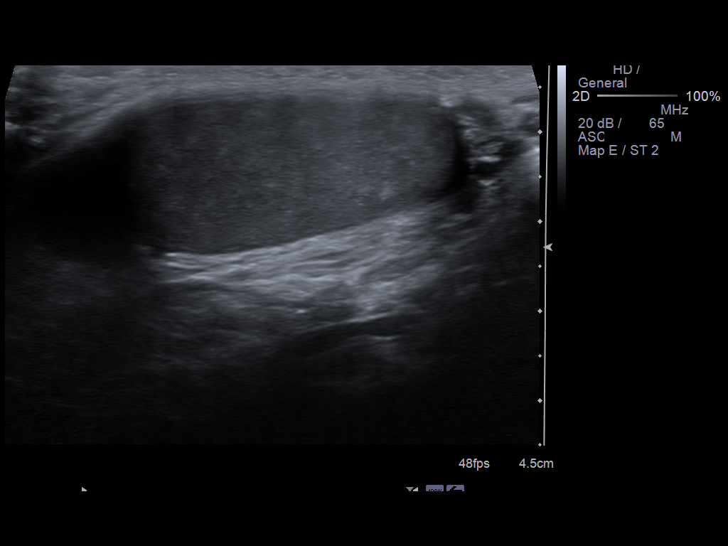
[im 43/64]
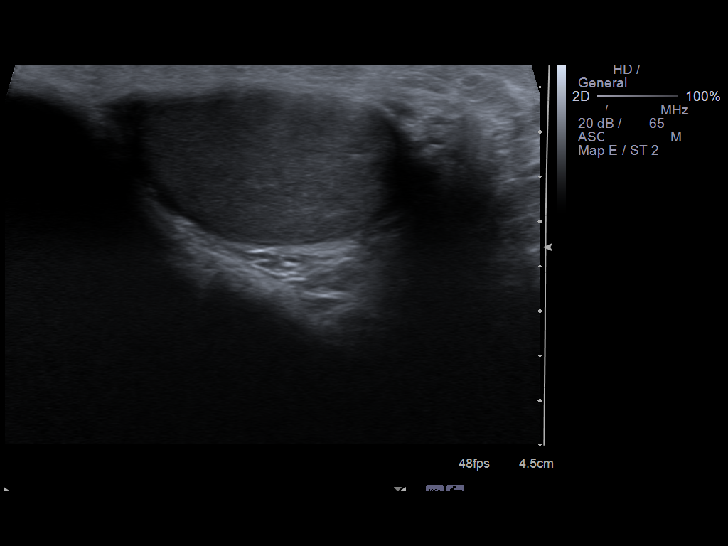
[im 48/64]
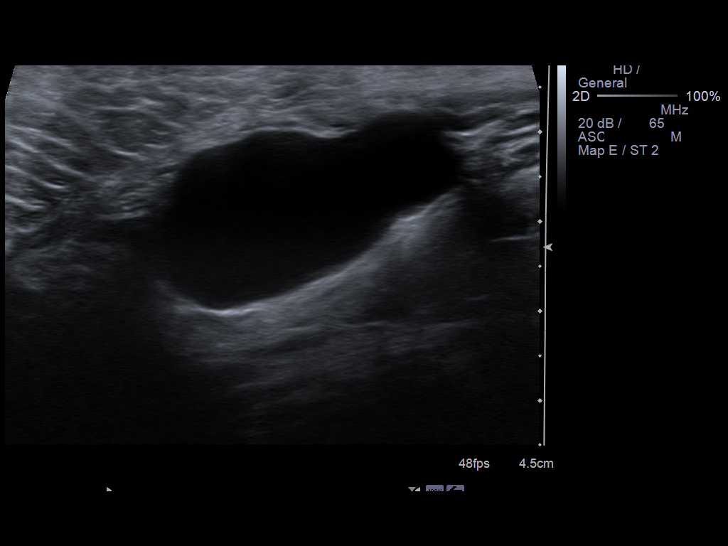
[im 53/64]
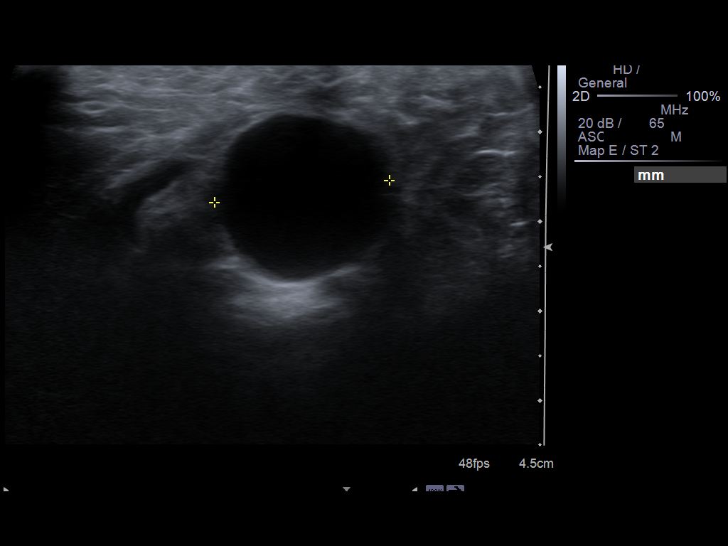
[im 58/64]
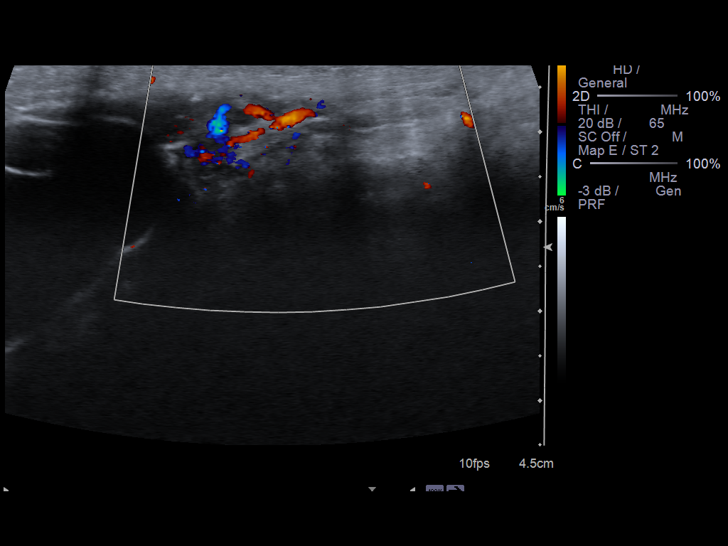
[im 64/64]
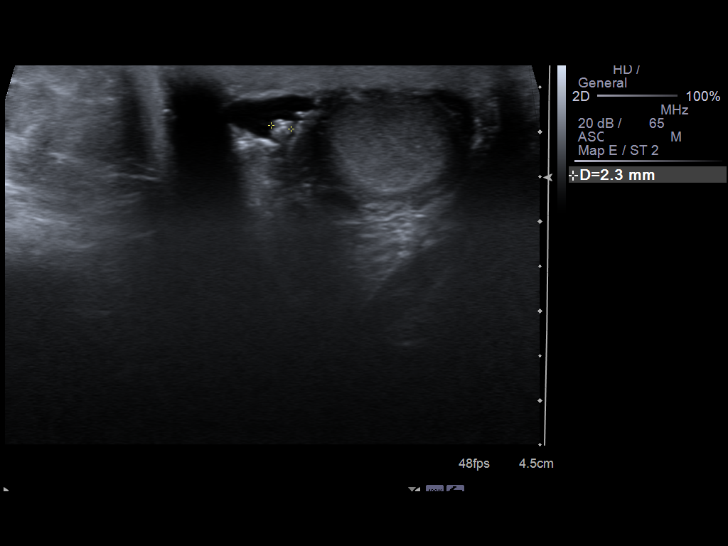

[14 of 25 positions shown; findings below may reference images not displayed]

FINDINGS: No testicular masses identified.  The testicles are
symmetrical in size.  Color flow Doppler is symmetrical.

There are multiple right epididymal cysts.  The largest measures
6.2 x 4.0 x 5.9 cm.  There is echogenic fluid within this cyst.

There are also multiple left epididymal cysts, the largest
measuring 3.6 x 1.8 x 2.0 cm.  No definite hydrocele or varicocele.
IMPRESSION: 1.  Large bilateral epididymal cysts, especially on the right.  See
report.
2.  The testicles are normal.
3.  No other acute or significant findings.

## 2013-12-02 ENCOUNTER — Other Ambulatory Visit: Payer: Self-pay | Admitting: Urology

## 2013-12-21 ENCOUNTER — Encounter (HOSPITAL_BASED_OUTPATIENT_CLINIC_OR_DEPARTMENT_OTHER): Payer: Self-pay | Admitting: *Deleted

## 2013-12-21 NOTE — Progress Notes (Signed)
12/21/13 1219  OBSTRUCTIVE SLEEP APNEA  Have you ever been diagnosed with sleep apnea through a sleep study? No  Do you snore loudly (loud enough to be heard through closed doors)?  1  Do you often feel tired, fatigued, or sleepy during the daytime? 0  Has anyone observed you stop breathing during your sleep? 0  Do you have, or are you being treated for high blood pressure? 1  BMI more than 35 kg/m2? 0  Age over 134 years old? 1  Neck circumference greater than 40 cm/16 inches? 1  Gender: 1  Obstructive Sleep Apnea Score 5  Score 4 or greater  Results sent to PCP

## 2013-12-21 NOTE — Progress Notes (Addendum)
SPOKE W/ PT THRU PACIFIC INTERPRETOR #[id number].  NPO AFTER MN. ARRIVE AT 0715. NEEDS ISTAT AND EKG. PT TO BRING MEDICATION DOS , HE DID NOT NAMES DURING CALL.  SPANISH INTERPRETOR, MALE PER PT REQUEST,  ARRANGED THRU CARE MANGEMENT/ INCLUSION TO ARRIVE AT 0700.

## 2013-12-23 ENCOUNTER — Ambulatory Visit (HOSPITAL_BASED_OUTPATIENT_CLINIC_OR_DEPARTMENT_OTHER): Payer: BC Managed Care – PPO | Admitting: Anesthesiology

## 2013-12-23 ENCOUNTER — Ambulatory Visit (HOSPITAL_BASED_OUTPATIENT_CLINIC_OR_DEPARTMENT_OTHER)
Admission: RE | Admit: 2013-12-23 | Discharge: 2013-12-23 | Disposition: A | Discharge status: Home / Self Care | Payer: BC Managed Care – PPO | Source: Ambulatory Visit | Attending: Urology | Admitting: Urology

## 2013-12-23 ENCOUNTER — Encounter (HOSPITAL_BASED_OUTPATIENT_CLINIC_OR_DEPARTMENT_OTHER)
Admission: RE | Disposition: A | Discharge status: Home / Self Care | Payer: Self-pay | Source: Ambulatory Visit | Attending: Urology

## 2013-12-23 ENCOUNTER — Encounter (HOSPITAL_BASED_OUTPATIENT_CLINIC_OR_DEPARTMENT_OTHER): Payer: BC Managed Care – PPO | Admitting: Anesthesiology

## 2013-12-23 ENCOUNTER — Encounter (HOSPITAL_BASED_OUTPATIENT_CLINIC_OR_DEPARTMENT_OTHER): Payer: Self-pay | Admitting: *Deleted

## 2013-12-23 DIAGNOSIS — N529 Male erectile dysfunction, unspecified: Secondary | ICD-10-CM | POA: Diagnosis not present

## 2013-12-23 DIAGNOSIS — E7800 Pure hypercholesterolemia, unspecified: Secondary | ICD-10-CM | POA: Diagnosis not present

## 2013-12-23 DIAGNOSIS — F411 Generalized anxiety disorder: Secondary | ICD-10-CM | POA: Diagnosis not present

## 2013-12-23 DIAGNOSIS — G4730 Sleep apnea, unspecified: Secondary | ICD-10-CM | POA: Diagnosis not present

## 2013-12-23 DIAGNOSIS — N432 Other hydrocele: Secondary | ICD-10-CM | POA: Diagnosis present

## 2013-12-23 DIAGNOSIS — N508 Other specified disorders of male genital organs: Secondary | ICD-10-CM | POA: Diagnosis not present

## 2013-12-23 DIAGNOSIS — F17200 Nicotine dependence, unspecified, uncomplicated: Secondary | ICD-10-CM | POA: Diagnosis not present

## 2013-12-23 DIAGNOSIS — E119 Type 2 diabetes mellitus without complications: Secondary | ICD-10-CM | POA: Insufficient documentation

## 2013-12-23 DIAGNOSIS — M109 Gout, unspecified: Secondary | ICD-10-CM | POA: Diagnosis not present

## 2013-12-23 HISTORY — DX: Other specified personal risk factors, not elsewhere classified: Z91.89

## 2013-12-23 HISTORY — DX: Essential (primary) hypertension: I10

## 2013-12-23 HISTORY — DX: Hydrocele, unspecified: N43.3

## 2013-12-23 HISTORY — DX: Hyperlipidemia, unspecified: E78.5

## 2013-12-23 HISTORY — DX: Type 2 diabetes mellitus without complications: E11.9

## 2013-12-23 HISTORY — PX: HYDROCELE EXCISION: SHX482

## 2013-12-23 LAB — GLUCOSE, CAPILLARY: Glucose-Capillary: 155 mg/dL — ABNORMAL HIGH (ref 70–99)

## 2013-12-23 LAB — POCT I-STAT, CHEM 8
BUN: 14 mg/dL (ref 6–23)
CALCIUM ION: 1.18 mmol/L (ref 1.12–1.23)
Chloride: 103 mEq/L (ref 96–112)
Creatinine, Ser: 0.8 mg/dL (ref 0.50–1.35)
Glucose, Bld: 145 mg/dL — ABNORMAL HIGH (ref 70–99)
HEMATOCRIT: 41 % (ref 39.0–52.0)
Hemoglobin: 13.9 g/dL (ref 13.0–17.0)
Potassium: 4.1 mEq/L (ref 3.7–5.3)
Sodium: 139 mEq/L (ref 137–147)
TCO2: 22 mmol/L (ref 0–100)

## 2013-12-23 SURGERY — HYDROCELECTOMY
Anesthesia: General | Site: Scrotum | Laterality: Bilateral

## 2013-12-23 MED ORDER — CEPHALEXIN 250 MG PO CAPS: 250.0000 mg | ORAL_CAPSULE | Freq: Four times a day (QID) | ORAL | Status: AC

## 2013-12-23 MED ORDER — PROMETHAZINE HCL 25 MG/ML IJ SOLN
6.2500 mg | INTRAMUSCULAR | Status: DC | PRN
  Filled 2013-12-23: qty 1

## 2013-12-23 MED ORDER — KETOROLAC TROMETHAMINE 30 MG/ML IJ SOLN
15.0000 mg | Freq: Once | INTRAMUSCULAR | Status: DC | PRN
  Filled 2013-12-23: qty 1

## 2013-12-23 MED ORDER — LIDOCAINE HCL (CARDIAC) 20 MG/ML IV SOLN
INTRAVENOUS | Status: DC | PRN
  Administered 2013-12-23: 60 mg via INTRAVENOUS

## 2013-12-23 MED ORDER — FENTANYL CITRATE 0.05 MG/ML IJ SOLN
INTRAMUSCULAR | Status: DC | PRN
  Administered 2013-12-23 (×7): 12.5 ug via INTRAVENOUS
  Administered 2013-12-23: 50 ug via INTRAVENOUS
  Administered 2013-12-23 (×5): 12.5 ug via INTRAVENOUS

## 2013-12-23 MED ORDER — ONDANSETRON HCL 4 MG/2ML IJ SOLN
INTRAMUSCULAR | Status: DC | PRN
  Administered 2013-12-23: 4 mg via INTRAVENOUS

## 2013-12-23 MED ORDER — KETOROLAC TROMETHAMINE 30 MG/ML IJ SOLN
INTRAMUSCULAR | Status: DC | PRN
  Administered 2013-12-23: 30 mg via INTRAVENOUS

## 2013-12-23 MED ORDER — MIDAZOLAM HCL 2 MG/2ML IJ SOLN
INTRAMUSCULAR | Status: AC
  Filled 2013-12-23: qty 2

## 2013-12-23 MED ORDER — LACTATED RINGERS IV SOLN
INTRAVENOUS | Status: DC
Start: 2013-12-23 — End: 2013-12-23
  Administered 2013-12-23: 08:00:00 via INTRAVENOUS
  Filled 2013-12-23: qty 1000

## 2013-12-23 MED ORDER — STERILE WATER FOR IRRIGATION IR SOLN
Status: DC | PRN
  Administered 2013-12-23: 500 mL

## 2013-12-23 MED ORDER — OXYCODONE HCL 5 MG/5ML PO SOLN
5.0000 mg | Freq: Once | ORAL | Status: AC | PRN
  Filled 2013-12-23: qty 5

## 2013-12-23 MED ORDER — PROPOFOL 10 MG/ML IV BOLUS
INTRAVENOUS | Status: DC | PRN
  Administered 2013-12-23: 200 mg via INTRAVENOUS

## 2013-12-23 MED ORDER — OXYCODONE HCL 5 MG PO TABS
5.0000 mg | ORAL_TABLET | Freq: Once | ORAL | Status: AC | PRN
  Administered 2013-12-23: 5 mg via ORAL
  Filled 2013-12-23: qty 1

## 2013-12-23 MED ORDER — MIDAZOLAM HCL 5 MG/5ML IJ SOLN
INTRAMUSCULAR | Status: DC | PRN
  Administered 2013-12-23 (×2): 1 mg via INTRAVENOUS

## 2013-12-23 MED ORDER — CEFAZOLIN SODIUM 1-5 GM-% IV SOLN
1.0000 g | INTRAVENOUS | Status: DC
  Filled 2013-12-23: qty 50

## 2013-12-23 MED ORDER — CEFAZOLIN SODIUM-DEXTROSE 2-3 GM-% IV SOLR
2.0000 g | INTRAVENOUS | Status: AC
  Administered 2013-12-23: 2 g via INTRAVENOUS
  Filled 2013-12-23: qty 50

## 2013-12-23 MED ORDER — FENTANYL CITRATE 0.05 MG/ML IJ SOLN
25.0000 ug | INTRAMUSCULAR | Status: DC | PRN
  Filled 2013-12-23: qty 1

## 2013-12-23 MED ORDER — HYDROCODONE-ACETAMINOPHEN 5-325 MG PO TABS: 1.0000 | ORAL_TABLET | Freq: Four times a day (QID) | ORAL | Status: AC | PRN

## 2013-12-23 MED ORDER — MINERAL OIL LIGHT 100 % EX OIL
TOPICAL_OIL | CUTANEOUS | Status: DC | PRN
  Administered 2013-12-23: 1 via TOPICAL

## 2013-12-23 MED ORDER — DEXAMETHASONE SODIUM PHOSPHATE 4 MG/ML IJ SOLN
INTRAMUSCULAR | Status: DC | PRN
  Administered 2013-12-23: 10 mg via INTRAVENOUS

## 2013-12-23 MED ORDER — OXYCODONE HCL 5 MG PO TABS
ORAL_TABLET | ORAL | Status: AC
  Filled 2013-12-23: qty 1

## 2013-12-23 MED ORDER — BUPIVACAINE HCL (PF) 0.25 % IJ SOLN
INTRAMUSCULAR | Status: DC | PRN
  Administered 2013-12-23: 8 mL

## 2013-12-23 MED ORDER — ACETAMINOPHEN 10 MG/ML IV SOLN
INTRAVENOUS | Status: DC | PRN
  Administered 2013-12-23: 1000 mg via INTRAVENOUS

## 2013-12-23 MED ORDER — LACTATED RINGERS IV SOLN
INTRAVENOUS | Status: DC | PRN
  Administered 2013-12-23 (×2): via INTRAVENOUS

## 2013-12-23 MED ORDER — FENTANYL CITRATE 0.05 MG/ML IJ SOLN
INTRAMUSCULAR | Status: AC
  Filled 2013-12-23: qty 4

## 2013-12-23 SURGICAL SUPPLY — 40 items
BENZOIN TINCTURE PRP APPL 2/3 (GAUZE/BANDAGES/DRESSINGS) IMPLANT
BLADE CLIPPER SURG (BLADE) ×3 IMPLANT
BLADE SURG 15 STRL LF DISP TIS (BLADE) ×1 IMPLANT
BLADE SURG 15 STRL SS (BLADE) ×2
BNDG GAUZE ELAST 4 BULKY (GAUZE/BANDAGES/DRESSINGS) ×3 IMPLANT
CANISTER SUCTION 2500CC (MISCELLANEOUS) ×3 IMPLANT
CLOSURE WOUND 1/2 X4 (GAUZE/BANDAGES/DRESSINGS)
CLOTH BEACON ORANGE TIMEOUT ST (SAFETY) ×3 IMPLANT
COVER MAYO STAND STRL (DRAPES) ×3 IMPLANT
COVER TABLE BACK 60X90 (DRAPES) ×3 IMPLANT
DERMABOND ADVANCED (GAUZE/BANDAGES/DRESSINGS) ×2
DERMABOND ADVANCED .7 DNX12 (GAUZE/BANDAGES/DRESSINGS) ×1 IMPLANT
DRAPE PED LAPAROTOMY (DRAPES) ×3 IMPLANT
ELECT NEEDLE TIP 2.8 STRL (NEEDLE) ×3 IMPLANT
ELECT REM PT RETURN 9FT ADLT (ELECTROSURGICAL) ×3
ELECTRODE REM PT RTRN 9FT ADLT (ELECTROSURGICAL) ×1 IMPLANT
GAUZE SPONGE 4X4 16PLY XRAY LF (GAUZE/BANDAGES/DRESSINGS) ×3 IMPLANT
GLOVE BIO SURGEON STRL SZ7 (GLOVE) ×3 IMPLANT
GLOVE BIOGEL PI IND STRL 7.5 (GLOVE) ×1 IMPLANT
GLOVE BIOGEL PI INDICATOR 7.5 (GLOVE) ×2
GLOVE INDICATOR 6.5 STRL GRN (GLOVE) ×3 IMPLANT
GLOVE INDICATOR 7.5 STRL GRN (GLOVE) ×3 IMPLANT
GOWN STRL REUS W/ TWL LRG LVL3 (GOWN DISPOSABLE) ×1 IMPLANT
GOWN STRL REUS W/ TWL XL LVL3 (GOWN DISPOSABLE) ×1 IMPLANT
GOWN STRL REUS W/TWL LRG LVL3 (GOWN DISPOSABLE) ×2
GOWN STRL REUS W/TWL XL LVL3 (GOWN DISPOSABLE) ×2
MARKER SKIN DUAL TIP RULER LAB (MISCELLANEOUS) ×3 IMPLANT
NEEDLE HYPO 25X1 1.5 SAFETY (NEEDLE) ×3 IMPLANT
PACK BASIN DAY SURGERY FS (CUSTOM PROCEDURE TRAY) ×3 IMPLANT
PAD PREP 24X48 CUFFED NSTRL (MISCELLANEOUS) ×3 IMPLANT
PENCIL BUTTON HOLSTER BLD 10FT (ELECTRODE) ×3 IMPLANT
STRIP CLOSURE SKIN 1/2X4 (GAUZE/BANDAGES/DRESSINGS) IMPLANT
SUPPORT SCROTAL LG STRP (MISCELLANEOUS) ×2 IMPLANT
SUPPORTER ATHLETIC LG (MISCELLANEOUS) ×1
SUT CHROMIC 3 0 PS 2 (SUTURE) ×3 IMPLANT
SUT CHROMIC 3 0 SH 27 (SUTURE) ×6 IMPLANT
SUT CHROMIC 4 0 SH 27 (SUTURE) ×3 IMPLANT
SYR CONTROL 10ML LL (SYRINGE) ×3 IMPLANT
WATER STERILE IRR 500ML POUR (IV SOLUTION) ×3 IMPLANT
YANKAUER SUCT BULB TIP NO VENT (SUCTIONS) ×3 IMPLANT

## 2013-12-23 NOTE — Anesthesia Procedure Notes (Signed)
Procedure Name: LMA Insertion Date/Time: 12/23/2013 8:41 AM Performed by: Jessica PriestBEESON, Reginald Hubert C Pre-anesthesia Checklist: Patient identified, Emergency Drugs available, Suction available and Patient being monitored Patient Re-evaluated:Patient Re-evaluated prior to inductionOxygen Delivery Method: Circle System Utilized Preoxygenation: Pre-oxygenation with 100% oxygen Intubation Type: IV induction Ventilation: Mask ventilation without difficulty LMA: LMA inserted LMA Size: 4.0 Number of attempts: 1 Airway Equipment and Method: bite block Placement Confirmation: positive ETCO2 Tube secured with: Tape Dental Injury: Teeth and Oropharynx as per pre-operative assessment

## 2013-12-23 NOTE — Anesthesia Preprocedure Evaluation (Addendum)
Anesthesia Evaluation  Patient identified by MRN, date of birth, ID band Patient awake    Reviewed: Allergy & Precautions, H&P , NPO status , Patient's Chart, lab work & pertinent test results  Airway Mallampati: IV TM Distance: >3 FB Neck ROM: Full    Dental  (+) Partial Upper, Lower Dentures   Pulmonary Current Smoker,  breath sounds clear to auscultation        Cardiovascular hypertension, Rhythm:Regular Rate:Normal     Neuro/Psych negative neurological ROS  negative psych ROS   GI/Hepatic negative GI ROS, Neg liver ROS,   Endo/Other  diabetes  Renal/GU negative Renal ROS     Musculoskeletal negative musculoskeletal ROS (+)   Abdominal   Peds  Hematology negative hematology ROS (+)   Anesthesia Other Findings   Reproductive/Obstetrics negative OB ROS                          Anesthesia Physical Anesthesia Plan  ASA: II  Anesthesia Plan: General   Post-op Pain Management:    Induction: Intravenous  Airway Management Planned: LMA  Additional Equipment: None  Intra-op Plan:   Post-operative Plan: Extubation in OR  Informed Consent: I have reviewed the patients History and Physical, chart, labs and discussed the procedure including the risks, benefits and alternatives for the proposed anesthesia with the patient or authorized representative who has indicated his/her understanding and acceptance.   Dental advisory given  Plan Discussed with: CRNA  Anesthesia Plan Comments:         Anesthesia Quick Evaluation

## 2013-12-23 NOTE — Transfer of Care (Signed)
Immediate Anesthesia Transfer of Care Note  Patient: Reginald Abbott  Procedure(s) Performed: Procedure(s) (LRB): HYDROCELECTOMY ADULT (Bilateral)  Patient Location: PACU  Anesthesia Type: General  Level of Consciousness: awake, sedated, patient cooperative and responds to stimulation  Airway & Oxygen Therapy: Patient Spontanous Breathing and Patient connected to face mask oxygen  Post-op Assessment: Report given to PACU RN, Post -op Vital signs reviewed and stable and Patient moving all extremities  Post vital signs: Reviewed and stable  Complications: No apparent anesthesia complications

## 2013-12-23 NOTE — Discharge Instructions (Signed)
HOME CARE INSTRUCTIONS FOR SCROTAL PROCEDURES ° °Wound Care & Hygiene: °You may apply an ice bag to the scrotum for the first 24 hours.  This may help decrease swelling and soreness.  You may have a dressing held in place by an athletic supporter.  You may remove the dressing in 24 hours and shower in 48 hours.  Continue to use the athletic supporter or tight briefs for at least a week. ° °Activity: °Rest today - not necessarily flat bed rest.  Just take it easy.  You should not do strenuous activities until your follow-up visit with your doctor.  You may resume light activity in 48 hours. ° °Return to Work: ° °Your doctor will advise you of this depending on the type of work you do ° °Diet: °Drink liquids or eat a light diet this evening.  You may resume a regular diet tomorrow. ° °General Expectations: °You may have a small amount of bleeding.  The scrotum may be swollen or bruised for about a week. ° °Call your Doctor if these occur: ° -persistent or heavy bleeding ° -temperature of 101 degrees or more ° -severe pain, not relieved by your pain medication ° °Return to Doctor's Office:  *** °Call to set up and appointment. ° °Patient Signature:  __________________________________________________ ° °Nurse's Signature:  __________________________________________________ ° °Post Anesthesia Home Care Instructions ° °Activity: °Get plenty of rest for the remainder of the day. A responsible adult should stay with you for 24 hours following the procedure.  °For the next 24 hours, DO NOT: °-Drive a car °-Operate machinery °-Drink alcoholic beverages °-Take any medication unless instructed by your physician °-Make any legal decisions or sign important papers. ° °Meals: °Start with liquid foods such as gelatin or soup. Progress to regular foods as tolerated. Avoid greasy, spicy, heavy foods. If nausea and/or vomiting occur, drink only clear liquids until the nausea and/or vomiting subsides. Call your physician if vomiting  continues. ° °Special Instructions/Symptoms: °Your throat may feel dry or sore from the anesthesia or the breathing tube placed in your throat during surgery. If this causes discomfort, gargle with warm salt water. The discomfort should disappear within 24 hours. ° °

## 2013-12-23 NOTE — H&P (Signed)
History of Present Illness [name] returns for follow-up. He still has difficulty with erections. Levitra did not work for him. Serum testosterone in November 2014 was within normal limits. He states that he had a more recent one that was low. He has bilateral hydroceles and they bother him sometimes and wants them removed. PSA was 2.56 in November 2014.   Past Medical History Problems  1. History of Anxiety (300.00) 2. History of Gout (274.9) 3. History of diabetes mellitus (V12.29) 4. History of hypercholesterolemia (V12.29) 5. History of sleep apnea (V13.89)  Current Meds 1. Lisinopril 5 MG Oral Tablet;  Therapy: (Recorded:22Apr2013) to Recorded 2. MetFORMIN HCl - 500 MG Oral Tablet;  Therapy: (Recorded:22Apr2013) to Recorded  Allergies Medication  1. No Known Drug Allergies  Family History Problems  1. Family history of Family Health Status - Father's Age   57 2. Family history of Family Health Status - Mother's Age   57 3. Family history of Family Health Status Number Of Children   3 sons/ 3 daughter 4. Family history of Family Health Status Number Of Children   3 sons and 3 daughters 5. No pertinent family history : Mother  Social History Problems  1. Denied: Alcohol Use 2. Alcohol Use   socially/ once every 1-3 months 3. Caffeine Use   1 4. Caffeine Use   1-2 5. Current every day smoker (305.1) 6. Marital History - Currently Married 7. Marital History - Currently Married 8. Occupation:   landscaper 9. Occupation:   Doctor, hospitalcommercial painter 10. Tobacco use (305.1)   2-3 cigarretes per day for on and off for yrs 11. Tobacco Use (V15.82)   smoked 4 cigarrettes a day for 29 years  Review of Systems Genitourinary, constitutional, skin, eye, otolaryngeal, hematologic/lymphatic, cardiovascular, pulmonary, endocrine, musculoskeletal, gastrointestinal, neurological and psychiatric system(s) were reviewed and pertinent findings if present are noted.   Genitourinary: erectile dysfunction and scrotal swelling.    Physical Exam Constitutional: Well nourished and well developed . No acute distress.  ENT:. The ears and nose are normal in appearance.  Neck: The appearance of the neck is normal and no neck mass is present.  Pulmonary: No respiratory distress and normal respiratory rhythm and effort.  Cardiovascular: Heart rate and rhythm are normal . No peripheral edema.  Abdomen: The abdomen is soft and nontender. No masses are palpated. No CVA tenderness. No hernias are palpable. No hepatosplenomegaly noted.  Genitourinary: Examination of the penis demonstrates no discharge, no masses, no lesions and a normal meatus. The scrotum is without lesions. The right epididymis is palpably normal and non-tender. The left epididymis is palpably normal and non-tender. The right testis is non-tender and without masses. The left testis is non-tender and without masses.  Lymphatics: The femoral and inguinal nodes are not enlarged or tender.  Skin: Normal skin turgor, no visible rash and no visible skin lesions.  Neuro/Psych:. Mood and affect are appropriate.    Results/Data Urine [Data Includes: Last 1 Day]   23Jul2015  COLOR YELLOW   APPEARANCE CLEAR   SPECIFIC GRAVITY 1.020   pH 5.5   GLUCOSE NEG mg/dL  BILIRUBIN NEG   KETONE NEG mg/dL  BLOOD NEG   PROTEIN NEG mg/dL  UROBILINOGEN 0.2 mg/dL  NITRITE NEG   LEUKOCYTE ESTERASE NEG    Assessment Assessed  1. Hydrocele, bilateral (603.9) 2. Erectile dysfunction (607.84) 3. Epididymal cyst (608.89)  Plan Health Maintenance  1. UA With REFLEX; [Do Not Release]; Status:Complete;   Done: 23Jul2015 02:42PM  Get lab  results from PCP. If serum testosterone is low will repeat with FSH, LH, prolactin. If testosterone is still low and all other tests are normal will discuss testosterone replacement. I went over the risks, benefits of hydrocelectomy. The risks include but are not limited to hemorrhage,  hematoma, infection, recurrence. He understands and wishes to proceed.

## 2013-12-23 NOTE — Anesthesia Postprocedure Evaluation (Signed)
  Anesthesia Post-op Note  Patient: Reginald Abbott  Procedure(s) Performed: Procedure(s): HYDROCELECTOMY ADULT (Bilateral)  Patient Location: PACU  Anesthesia Type:General  Level of Consciousness: awake, alert  and oriented  Airway and Oxygen Therapy: Patient Spontanous Breathing  Post-op Pain: none  Post-op Assessment: Post-op Vital signs reviewed  Post-op Vital Signs: Reviewed  Last Vitals:  Filed Vitals:   12/23/13 1115  BP: 135/73  Pulse: 79  Temp:   Resp: 14    Complications: No apparent anesthesia complications

## 2013-12-23 NOTE — Op Note (Signed)
Reginald Abbott BloodM Cali is a 58 y.o.   12/23/2013  General  Pre-op diagnosis: Bilateral hydroceles  Postop diagnosis: Bilateral hydroceles, right epididymal cysts, hydrocele left cord  Procedure done: Bilateral hydrocelectomy  Surgeon: Wendie SimmerMarc H. Yanitza Shvartsman  Anesthesia: General   Indication: Patient is a 58 years old male with a history of bilateral hydroceles. He has been complaining of testicular pain for several months. The hydroceles are getting larger. The right hydrocele is larger than the left. He wanted to have them removed. He is scheduled today for the procedure. The risks of the procedure were reviewed with him. They include but are not limited to hemorrhage, hematoma, infection, recurrence. He understands and like to proceed  Procedure: Patient was identified by his wrist band and proper timeout was taken.  Under general anesthesia he was prepped and draped and placed in the supine position. The scrotal skin was infiltrated with 0.25% Marcaine. A longitudinal incision was made on the scrotum. The incision was carried down to the tunica vaginalis. Hemostasis was secured with electrocautery. The tunica vaginalis was then incised. 100 cc of fluid were drained out of the hydrocele sac. Several cysts were noted at the head of the right epididymis. The cysts were excised. Hemostasis was secured with electrocautery. The edges of the cysts excision were then approximated with #4-0 chromic. The tunica vaginalis was then plicated with #4-0 chromic. Hemostasis was completed with electrocautery. The wound was irrigated with normal saline. The testicle was then replaced in the scrotum. The scrotum was then closed in 2 layers with #3-0 chromic.  The left scrotum was infiltrated with 0.25% Marcaine. A longitudinal incision was made on the scrotum. The incision was carried down to the tunica vaginalis. Hemostasis was secured with electrocautery. The tunica vaginalis was then incised. There was minimal amount of  fluid inside of the tunica vaginalis. A moderate size hydrocele of the cord was found. There were also several epididymal cysts. The hydrocele of the cord was excised from the rest of the cord. The epididymal cysts were also excised. The hydrocele of the cord and the cysts were removed in toto. Hemostasis was completed with electrocautery. The edges of the incisions were then approximated with #4-0 chromic. The wound was irrigated with normal saline. The testicle was then replaced in the scrotum. The scrotum was then closed in 2 layers with #3-0 chromic.  Dermabond was applied to the incision. Sterile dressing was placed on the scrotum.  Patient tolerated the procedure well and left the OR in satisfactory condition to postanesthesia care unit   EBL:  Minimal  Needles, sponges count:  Correct on 2 counts.

## 2013-12-26 ENCOUNTER — Encounter (HOSPITAL_BASED_OUTPATIENT_CLINIC_OR_DEPARTMENT_OTHER): Payer: Self-pay | Admitting: Urology

## 2019-01-15 ENCOUNTER — Emergency Department (HOSPITAL_COMMUNITY): Payer: Self-pay

## 2019-01-15 ENCOUNTER — Encounter (HOSPITAL_COMMUNITY): Payer: Self-pay | Admitting: *Deleted

## 2019-01-15 ENCOUNTER — Other Ambulatory Visit: Payer: Self-pay

## 2019-01-15 ENCOUNTER — Emergency Department (HOSPITAL_COMMUNITY)
Admission: EM | Admit: 2019-01-15 | Discharge: 2019-01-15 | Disposition: A | Discharge status: Home / Self Care | Payer: Self-pay | Attending: Emergency Medicine | Admitting: Emergency Medicine

## 2019-01-15 DIAGNOSIS — Z79899 Other long term (current) drug therapy: Secondary | ICD-10-CM | POA: Insufficient documentation

## 2019-01-15 DIAGNOSIS — Z7984 Long term (current) use of oral hypoglycemic drugs: Secondary | ICD-10-CM | POA: Insufficient documentation

## 2019-01-15 DIAGNOSIS — R5383 Other fatigue: Secondary | ICD-10-CM | POA: Insufficient documentation

## 2019-01-15 DIAGNOSIS — R51 Headache: Secondary | ICD-10-CM | POA: Insufficient documentation

## 2019-01-15 DIAGNOSIS — R6883 Chills (without fever): Secondary | ICD-10-CM | POA: Insufficient documentation

## 2019-01-15 DIAGNOSIS — M549 Dorsalgia, unspecified: Secondary | ICD-10-CM | POA: Insufficient documentation

## 2019-01-15 DIAGNOSIS — I10 Essential (primary) hypertension: Secondary | ICD-10-CM | POA: Insufficient documentation

## 2019-01-15 DIAGNOSIS — Z20828 Contact with and (suspected) exposure to other viral communicable diseases: Secondary | ICD-10-CM | POA: Insufficient documentation

## 2019-01-15 DIAGNOSIS — E119 Type 2 diabetes mellitus without complications: Secondary | ICD-10-CM | POA: Insufficient documentation

## 2019-01-15 DIAGNOSIS — M25512 Pain in left shoulder: Secondary | ICD-10-CM | POA: Insufficient documentation

## 2019-01-15 DIAGNOSIS — R1010 Upper abdominal pain, unspecified: Secondary | ICD-10-CM | POA: Insufficient documentation

## 2019-01-15 DIAGNOSIS — Z7189 Other specified counseling: Secondary | ICD-10-CM

## 2019-01-15 DIAGNOSIS — R079 Chest pain, unspecified: Secondary | ICD-10-CM | POA: Insufficient documentation

## 2019-01-15 DIAGNOSIS — M25511 Pain in right shoulder: Secondary | ICD-10-CM | POA: Insufficient documentation

## 2019-01-15 DIAGNOSIS — B0089 Other herpesviral infection: Secondary | ICD-10-CM | POA: Insufficient documentation

## 2019-01-15 DIAGNOSIS — F17210 Nicotine dependence, cigarettes, uncomplicated: Secondary | ICD-10-CM | POA: Insufficient documentation

## 2019-01-15 LAB — BASIC METABOLIC PANEL
Anion gap: 11 (ref 5–15)
BUN: 19 mg/dL (ref 8–23)
CO2: 24 mmol/L (ref 22–32)
Calcium: 9.3 mg/dL (ref 8.9–10.3)
Chloride: 103 mmol/L (ref 98–111)
Creatinine, Ser: 1.05 mg/dL (ref 0.61–1.24)
GFR calc Af Amer: >60 mL/min (ref >60)
GFR calc non Af Amer: >60 mL/min (ref >60)
Glucose, Bld: 75 mg/dL (ref 70–99)
Potassium: 4.1 mmol/L (ref 3.5–5.1)
Sodium: 138 mmol/L (ref 135–145)

## 2019-01-15 LAB — TROPONIN I (HIGH SENSITIVITY): Troponin I (High Sensitivity): <2 ng/L (ref <18)

## 2019-01-15 LAB — CBC
HCT: 40.2 % (ref 39.0–52.0)
Hemoglobin: 13.3 g/dL (ref 13.0–17.0)
MCH: 31.7 pg (ref 26.0–34.0)
MCHC: 33.1 g/dL (ref 30.0–36.0)
MCV: 95.9 fL (ref 80.0–100.0)
Platelets: 219 10*3/uL (ref 150–400)
RBC: 4.19 MIL/uL — ABNORMAL LOW (ref 4.22–5.81)
RDW: 12.3 % (ref 11.5–15.5)
WBC: 7.9 10*3/uL (ref 4.0–10.5)
nRBC: 0 % (ref 0.0–0.2)

## 2019-01-15 MED ORDER — SODIUM CHLORIDE 0.9% FLUSH: 3.0000 mL | Freq: Once | INTRAVENOUS | Status: DC

## 2019-01-15 MED ORDER — NAPROXEN 500 MG PO TABS: 500.0000 mg | ORAL_TABLET | Freq: Two times a day (BID) | ORAL | 0 refills | Status: AC

## 2019-01-15 MED ORDER — VALACYCLOVIR HCL 1 G PO TABS: 1000.0000 mg | ORAL_TABLET | Freq: Three times a day (TID) | ORAL | 0 refills | Status: AC

## 2019-01-15 NOTE — Discharge Instructions (Signed)
Take the medications as prescribed, follow-up with your doctor to be rechecked if the symptoms are not improving.

## 2019-01-15 NOTE — ED Provider Notes (Signed)
[facility]-EMERGENCY DEPT Provider Note   CSN: [id number] Arrival date & time: 01/15/19  1409     History   Chief Complaint Chief Complaint  Patient presents with  . Chest Pain  . Shortness of Breath  . Headache    HPI Reginald Abbott is a 63 y.o. male.     HPI Pt states he has been having trouble with headache as well as pain in his back and shoulders over the last few days.  The sx were gradual in onset.  He has also felt fatigued.  He denies any fever but has felt chilled.  He has not taken his temperature.  He denies any coughing.  No trouble urinating.  No vomiting or diarrhea.  .  He woke up this am again with the headache and it has not gone away.   Patient has also noticed some sores on his index finger.  They look like a small cluster of blisters.  He has had this before and they always come and go.   Pt states he also has had some chronic upper abd pain discomfort for around 6 months. He has seen doctors in the past who gave him medications.  They helped but the sx returned.   Past Medical History:  Diagnosis Date  . At risk for sleep apnea    STOP-BANG= 5     SENT TO PCP 12-21-2013  . Bilateral hydrocele   . Dyslipidemia   . Hypertension   . Type 2 diabetes mellitus [facility])     Patient Active Problem List   Diagnosis Date Noted  . 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 Nonspecific (abnormal) findings on radiological and other examination of body structure 10/24/2008  . NONSPCIFC ABN FINDING RAD & OTH EXAM LUNG FIELD 10/24/2008  . COSTOCHONDRITIS 11/17/2007  . DYSLIPIDEMIA 09/16/2007  . ERECTILE DYSFUNCTION 09/16/2007  . URI 05/18/2007  . BENIGN PROSTATIC HYPERTROPHY, WITH OBSTRUCTION 05/18/2007  . LUMBAR RADICULOPATHY, RIGHT 03/16/2007  . TROCHANTERIC BURSITIS, RIGHT 02/16/2007  . DIABETES MELLITUS, TYPE II 01/29/2007  . INSOMNIA, CHRONIC 01/29/2007  . Pain in joint, lower leg 01/29/2007  . SYMPTOM, HESITANCY, URINARY  01/29/2007  . 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 HYDROCELE EXCISION Bilateral 12/23/2013   Procedure: HYDROCELECTOMY ADULT;  Surgeon: Danae Chen, MD;  Location: [facility];  Service: Urology;  Laterality: Bilateral;        Home Medications    Prior to Admission medications   Medication Sig Start Date End Date Taking? Authorizing Provider  cephALEXin (KEFLEX) 250 MG capsule Take 1 capsule (250 mg total) by mouth 4 (four) times daily. 12/23/13   Su Grand, MD  fenofibrate 160 MG tablet Take 160 mg by mouth daily.    [provider]  HYDROcodone-acetaminophen (NORCO) 5-325 MG per tablet Take 1 tablet by mouth every 6 (six) hours as needed for moderate pain. 12/23/13   Su Grand, MD  lisinopril (PRINIVIL,ZESTRIL) 10 MG tablet Take 10 mg by mouth daily.    [provider]  metFORMIN (GLUCOPHAGE) 850 MG tablet Take 850 mg by mouth 2 (two) times daily with a meal.    [provider]  naproxen (NAPROSYN) 500 MG tablet Take 1 tablet (500 mg total) by mouth 2 (two) times daily with a meal. As needed for pain 01/15/19   Linwood Dibbles, MD  valACYclovir (VALTREX) 1000 MG tablet Take 1 tablet (1,000 mg total) by  mouth 3 (three) times daily. 01/15/19   Linwood DibblesKnapp, Dashanae Longfield, MD    Family History No family history on file.  Social History Social History   Tobacco Use  . Smoking status: Current Every Day Smoker    Packs/day: 0.25    Years: 20.00    Pack years: 5.00    Types: Cigarettes  . Smokeless tobacco: Never Used  Substance Use Topics  . Alcohol use: Yes    Comment: OCCASIONAL BEER  . Drug use: No     Allergies   Patient has no known allergies.   Review of Systems Review of Systems  All other systems reviewed and are negative.    Physical Exam Updated Vital Signs BP (!) 143/83 (BP Location: Left Arm)   Pulse 71   Temp 98.3 F (36.8 C) (Oral)   Resp 18   Ht 1.651 m (5'  5")   Wt 69.4 kg   SpO2 100%   BMI 25.46 kg/m   Physical Exam Vitals signs and nursing note reviewed.  Constitutional:      General: He is not in acute distress.    Appearance: He is well-developed. He is not ill-appearing or toxic-appearing.  HENT:     Head: Normocephalic and atraumatic.     Right Ear: External ear normal.     Left Ear: External ear normal.  Eyes:     General: No scleral icterus.       Right eye: No discharge.        Left eye: No discharge.     Conjunctiva/sclera: Conjunctivae normal.  Neck:     Musculoskeletal: Neck supple.     Trachea: No tracheal deviation.     Comments: Full range of motion no meningismus Cardiovascular:     Rate and Rhythm: Normal rate and regular rhythm.  Pulmonary:     Effort: Pulmonary effort is normal. No respiratory distress.     Breath sounds: Normal breath sounds. No stridor. No wheezing or rales.  Abdominal:     General: Bowel sounds are normal. There is no distension.     Palpations: Abdomen is soft.     Tenderness: There is no abdominal tenderness. There is no guarding or rebound.  Musculoskeletal:        General: No tenderness.     Comments: Small clusters of vesicular lesion on the index finger suggestive of a herpetic whitlow  Skin:    General: Skin is warm and dry.     Findings: No rash.  Neurological:     Mental Status: He is alert.     Cranial Nerves: No cranial nerve deficit (no facial droop, extraocular movements intact, no slurred speech).     Sensory: No sensory deficit.     Motor: No abnormal muscle tone or seizure activity.     Coordination: Coordination normal.      ED Treatments / Results  Labs (all labs ordered are listed, but only abnormal results are displayed) Labs Reviewed  CBC - Abnormal; Notable for the following components:      Result Value   RBC 4.19 (*)    All other components within normal limits  SARS CORONAVIRUS 2 (TAT 6-24 HRS)  BASIC METABOLIC PANEL  TROPONIN I (HIGH SENSITIVITY)     EKG None  Radiology Dg Chest 2 View  Result Date: 01/15/2019 CLINICAL DATA:  Chest pain and shortness of breath, headache, subjective fever, symptoms for 4 days, history hypertension, type II diabetes mellitus EXAM: CHEST - 2 VIEW COMPARISON:  None  FINDINGS: Normal heart size, mediastinal contours, and pulmonary vascularity. Probable BILATERAL nipple shadows, seen on lateral view as well. Lungs clear. No infiltrate, pleural effusion or pneumothorax. Scattered endplate spur formation thoracic spine. IMPRESSION: No acute abnormalities. Electronically Signed   By: Lavonia Dana M.D.   On: 01/15/2019 15:18    Procedures Procedures (including critical care time)  Medications Ordered in ED Medications  sodium chloride flush (NS) 0.9 % injection 3 mL (has no administration in time range)     Initial Impression / Assessment and Plan / ED Course  I have reviewed the triage vital signs and the nursing notes.  Pertinent labs & imaging results that were available during my care of the patient were reviewed by me and considered in my medical decision making (see chart for details).   Patient presented to the emergency room with complaints of headache myalgias and chills but no definite fevers.  Patient's ED work-up is reassuring.  Chest x-ray without signs of pneumonia.  Laboratory tests are otherwise unremarkable.  Patient was concerned about the possibly of covid infection.  He is not having any fever and does not have any difficulty with his breathing.  He denies any cough but it certainly reasonable to test.  Patient finger exam to suggest a possible herpetic whitlow.  I will start him a course of antivirals and NSAIDs.  Follow-up with primary care doctor. Devian JARMARCUS WAMBOLD was evaluated in Emergency Department on 01/15/2019 for the symptoms described in the history of present illness. He was evaluated in the context of the global COVID-19 pandemic, which necessitated consideration that the patient  might be at risk for infection with the SARS-CoV-2 virus that causes COVID-19. Institutional protocols and algorithms that pertain to the evaluation of patients at risk for COVID-19 are in a state of rapid change based on information released by regulatory bodies including the CDC and federal and state organizations. These policies and algorithms were followed during the patient's care in the ED.  Final Clinical Impressions(s) / ED Diagnoses   Final diagnoses:  Herpetic whitlow  Educated About Covid-19 Virus Infection    ED Discharge Orders         Ordered    valACYclovir (VALTREX) 1000 MG tablet  3 times daily     01/15/19 1922    naproxen (NAPROSYN) 500 MG tablet  2 times daily with meals     01/15/19 Gustavus Bryant, MD 01/15/19 Curly Rim

## 2019-01-15 NOTE — ED Triage Notes (Signed)
Pt complains of chest pain, shortness of breath, headache for the past 4 days. Pt also reports subjective fever.

## 2019-01-16 LAB — SARS CORONAVIRUS 2 (TAT 6-24 HRS): SARS Coronavirus 2: NEGATIVE

## 2020-10-24 ENCOUNTER — Other Ambulatory Visit: Payer: Self-pay | Admitting: Family Medicine

## 2020-10-24 ENCOUNTER — Ambulatory Visit
Admission: RE | Admit: 2020-10-24 | Discharge: 2020-10-24 | Disposition: A | Discharge status: Home / Self Care | Payer: 59 | Source: Ambulatory Visit | Attending: Family Medicine | Admitting: Family Medicine

## 2020-10-24 DIAGNOSIS — M25552 Pain in left hip: Secondary | ICD-10-CM

## 2021-03-07 NOTE — Progress Notes (Signed)
[facility] - Clinic Note  03/11/2021     CHIEF COMPLAINT Patient presents for Retina Evaluation   HISTORY OF PRESENT ILLNESS: Reginald Abbott is a 65 y.o. male who presents to the clinic today for:   HPI     Retina Evaluation   In both eyes.  This started 21 years ago.  I, the attending physician,  performed the HPI with the patient and updated documentation appropriately.        Comments   Pt referred from [facility] for DM exam. Pt states he has been diabetic since 2001. Has not had an eye exam in 10 years at least. Most recent A1C was 10. He does not report any vision issues or changes recently.       Last edited by Rennis Chris, MD on 03/12/2021 12:49 AM.     Referring physician: Verlon Au, MD [phone] S. [address] [place],  [place] [address]  HISTORICAL INFORMATION:  Selected notes from the MEDICAL RECORD NUMBER Referred by Dr. Roxan Hockey for DM eval   CURRENT MEDICATIONS: No current outpatient medications on file. (Ophthalmic Drugs)   No current facility-administered medications for this visit. (Ophthalmic Drugs)   Current Outpatient Medications (Other)  Medication Sig   cephALEXin (KEFLEX) 250 MG capsule Take 1 capsule (250 mg total) by mouth 4 (four) times daily.   fenofibrate 160 MG tablet Take 160 mg by mouth daily.   HYDROcodone-acetaminophen (NORCO) 5-325 MG per tablet Take 1 tablet by mouth every 6 (six) hours as needed for moderate pain.   lisinopril (PRINIVIL,ZESTRIL) 10 MG tablet Take 10 mg by mouth daily.   metFORMIN (GLUCOPHAGE) 850 MG tablet Take 850 mg by mouth 2 (two) times daily with a meal.   naproxen (NAPROSYN) 500 MG tablet Take 1 tablet (500 mg total) by mouth 2 (two) times daily with a meal. As needed for pain   valACYclovir (VALTREX) 1000 MG tablet Take 1 tablet (1,000 mg total) by mouth 3 (three) times daily.   No current facility-administered medications for this visit. (Other)   REVIEW OF  SYSTEMS: ROS   Positive for: Endocrine, Cardiovascular Negative for: Constitutional, Gastrointestinal, Neurological, Skin, Genitourinary, Musculoskeletal, HENT, Eyes, Respiratory, Psychiatric, Allergic/Imm, Heme/Lymph Last edited by Thompson Grayer, COT on 03/11/2021  9:11 AM.     ALLERGIES No Known Allergies  PAST MEDICAL HISTORY Past Medical History:  Diagnosis Date   At risk for sleep apnea    STOP-BANG= 5     SENT TO PCP 12-21-2013   Bilateral hydrocele    Dyslipidemia    Hypertension    Type 2 diabetes mellitus (HCC)    Past Surgical History:  Procedure Laterality Date   HYDROCELE EXCISION Bilateral 12/23/2013   Procedure: HYDROCELECTOMY ADULT;  Surgeon: Danae Chen, MD;  Location: [facility];  Service: Urology;  Laterality: Bilateral;    FAMILY HISTORY History reviewed. No pertinent family history.  SOCIAL HISTORY Social History   Tobacco Use   Smoking status: Every Day    Packs/day: 0.25    Years: 20.00    Pack years: 5.00    Types: Cigarettes   Smokeless tobacco: Never  Substance Use Topics   Alcohol use: Yes    Comment: OCCASIONAL BEER   Drug use: No         OPHTHALMIC EXAM:  Base Eye Exam     Visual Acuity (Snellen - Linear)       Right Left   Dist [place]  20/20 20/20 -1         Tonometry (Tonopen, 9:28 AM)       Right Left   Pressure 11 18         Pupils       Dark Light Shape React APD   Right 3 2 Round Brisk None   Left 3 2 Round Brisk None         Visual Fields (Counting fingers)       Left Right    Full Full         Extraocular Movement       Right Left    Full, Ortho Full, Ortho         Neuro/Psych     Oriented x3: Yes   Mood/Affect: Normal         Dilation     Both eyes: 1.0% Mydriacyl, 2.5% Phenylephrine @ 9:29 AM           Slit Lamp and Fundus Exam     Slit Lamp Exam       Right Left   Lids/Lashes Dermato, mild MGD Dermato, mild MGD   Conjunctiva/Sclera White and quiet  White and quiet   Cornea 1+ inferior PEE, mild tear film debris 1+ inferior PEE, mild tear film debris   Anterior Chamber Deep and quiet, narrow temporal angle Deep and quiet, narrow temporal angle   Iris Round and dilated, no NVI Round and dilated, no NVI   Lens 2+ NS, 2+ cortical 2+ NS, 2+ cortical   Vitreous Synerisis Synerisis         Fundus Exam       Right Left   Disc Pink, sharp Pink, sharp, +cupping   C/D Ratio 0.6 0.6   Macula Flat, good foveal reflex, scattered punctate MA Flat, good foveal reflex, scattered punctate MA, small cluster of MA and exudate temporal to fovea, +cystic changes   Vessels Attenuated, mild copper wiring Attenuated, mild copper wiring   Periphery Attached, scattered punctate MA Attached, scattered punctate MA           Refraction     Manifest Refraction       Sphere Cylinder Axis Dist VA   Right +0.50 +0.25 155 20/20   Left [place] +0.75 010 20/20            IMAGING AND PROCEDURES  Imaging and Procedures for 03/11/2021  OCT, Retina - OU - Both Eyes       Right Eye Quality was good. Central Foveal Thickness: 256. Progression has no prior data. Findings include normal foveal contour, no IRF, no SRF (Mild cystic changes IT fovea).   Left Eye Quality was good. Central Foveal Thickness: 241. Progression has no prior data. Findings include normal foveal contour, intraretinal fluid, intraretinal hyper-reflective material, no SRF (Focal cystic changes temporal fovea).   Notes *Images captured and stored on drive  Diagnosis / Impression:  OD: Mild cystic changes IT fovea OS: Focal cystic changes temporal fovea  Clinical management:  See below  Abbreviations: NFP - Normal foveal profile. CME - cystoid macular edema. PED - pigment epithelial detachment. IRF - intraretinal fluid. SRF - subretinal fluid. EZ - ellipsoid zone. ERM - epiretinal membrane. ORA - outer retinal atrophy. ORT - outer retinal tubulation. SRHM - subretinal  hyper-reflective material. IRHM - intraretinal hyper-reflective material            ASSESSMENT/PLAN:    ICD-10-CM   1. Retinal edema  H35.81 OCT, Retina - OU -  Both Eyes    2. Moderate nonproliferative diabetic retinopathy of both eyes with macular edema associated with type 2 diabetes mellitus (HCC)  P59.4585     3. Essential hypertension  I10     4. Hypertensive retinopathy of both eyes  H35.033     5. Combined forms of age-related cataract of both eyes  H25.813      1,2. Mod non-proliferative diabetic retinopathy w/ DME OU  - most recent A1c 10.2 on 10.11.22 - The incidence, risk factors for progression, natural history and treatment options for diabetic retinopathy were discussed with patient.   - The need for close monitoring of blood glucose, blood pressure, and serum lipids, avoiding cigarette or any type of tobacco, and the need for long term follow up was also discussed with patient. - exam 10.31.22 shows scattered punctate MA OU - OCT shows OD: Mild cystic changes IT fovea; OS: Focal cystic changes temporal fovea - The natural history, pathology, and characteristics of diabetic macular edema discussed with patient.  A generalized discussion of the major clinical trials concerning treatment of diabetic macular edema (ETDRS, DCT, SCORE, RISE / RIDE, and ongoing DRCR net studies) was completed.  This discussion included mention of the various approaches to treating diabetic macular edema (observation, laser photocoagulation, anti-VEGF injections with lucentis / Avastin / Eylea, steroid injections with Kenalog / Ozurdex, and intraocular surgery with vitrectomy).  The goal hemoglobin A1C of 6-7 was discussed, as well as importance of smoking cessation and hypertension control.  Need for ongoing treatment and monitoring were specifically discussed with reference to chronic nature of diabetic macular edema. - f/u in 3-4 mos -- DFE/OCT, Optos FA (Transit OS)  3,4. Hypertensive  retinopathy OU - discussed importance of tight BP control - monitor   5. Mixed Cataract OU - The symptoms of cataract, surgical options, and treatments and risks were discussed with patient. - discussed diagnosis and progression - monitor   Ophthalmic Meds Ordered this visit:  No orders of the defined types were placed in this encounter.     Return in about 3 months (around 06/11/2021) for mod NPDR w /DME OU -- DFE/OCT/Optos FA (Transit OS).  There are no Patient Instructions on file for this visit.  Explained the diagnoses, plan, and follow up with the patient and they expressed understanding.  Patient expressed understanding of the importance of proper follow up care.   This document serves as a record of services personally performed by Karie Chimera, MD, PhD. It was created on their behalf by Joni Reining, an ophthalmic technician. The creation of this record is the provider's dictation and/or activities during the visit.    Electronically signed by: Joni Reining COA, 03/12/21  12:50 AM  This document serves as a record of services personally performed by Karie Chimera, MD, PhD. It was created on their behalf by Cristopher Estimable, COT an ophthalmic technician. The creation of this record is the provider's dictation and/or activities during the visit.    Electronically signed by: Cristopher Estimable, COT 10.31.22 @ 12:50 AM   Karie Chimera, M.D., Ph.D. Diseases & Surgery of the Retina and Vitreous [facility] 10.31.22  I have reviewed the above documentation for accuracy and completeness, and I agree with the above. Karie Chimera, M.D., Ph.D. 03/12/21 1:11 AM   Abbreviations: M myopia (nearsighted); A astigmatism; H hyperopia (farsighted); P presbyopia; Mrx spectacle prescription;  CTL contact lenses; OD right eye; OS left eye; OU both eyes  XT exotropia; ET esotropia; PEK punctate epithelial keratitis; PEE punctate epithelial erosions; DES dry eye  syndrome; MGD meibomian gland dysfunction; ATs artificial tears; PFAT's preservative free artificial tears; NSC nuclear sclerotic cataract; PSC posterior subcapsular cataract; ERM epi-retinal membrane; PVD posterior vitreous detachment; RD retinal detachment; DM diabetes mellitus; DR diabetic retinopathy; NPDR non-proliferative diabetic retinopathy; PDR proliferative diabetic retinopathy; CSME clinically significant macular edema; DME diabetic macular edema; dbh dot blot hemorrhages; CWS cotton wool spot; POAG primary open angle glaucoma; C/D cup-to-disc ratio; HVF humphrey visual field; GVF goldmann visual field; OCT optical coherence tomography; IOP intraocular pressure; BRVO Branch retinal vein occlusion; CRVO central retinal vein occlusion; CRAO central retinal artery occlusion; BRAO branch retinal artery occlusion; RT retinal tear; SB scleral buckle; PPV pars plana vitrectomy; VH Vitreous hemorrhage; PRP panretinal laser photocoagulation; IVK intravitreal kenalog; VMT vitreomacular traction; MH Macular hole;  NVD neovascularization of the disc; NVE neovascularization elsewhere; AREDS age related eye disease study; ARMD age related macular degeneration; POAG primary open angle glaucoma; EBMD epithelial/anterior basement membrane dystrophy; ACIOL anterior chamber intraocular lens; IOL intraocular lens; PCIOL posterior chamber intraocular lens; Phaco/IOL phacoemulsification with intraocular lens placement; PRK photorefractive keratectomy; LASIK laser assisted in situ keratomileusis; HTN hypertension; DM diabetes mellitus; COPD chronic obstructive pulmonary disease

## 2021-03-11 ENCOUNTER — Other Ambulatory Visit: Payer: Self-pay

## 2021-03-11 ENCOUNTER — Encounter (INDEPENDENT_AMBULATORY_CARE_PROVIDER_SITE_OTHER): Payer: Self-pay | Admitting: Ophthalmology

## 2021-03-11 ENCOUNTER — Ambulatory Visit (INDEPENDENT_AMBULATORY_CARE_PROVIDER_SITE_OTHER): Payer: 59 | Admitting: Ophthalmology

## 2021-03-11 DIAGNOSIS — H35033 Hypertensive retinopathy, bilateral: Secondary | ICD-10-CM | POA: Diagnosis not present

## 2021-03-11 DIAGNOSIS — I10 Essential (primary) hypertension: Secondary | ICD-10-CM

## 2021-03-11 DIAGNOSIS — H25813 Combined forms of age-related cataract, bilateral: Secondary | ICD-10-CM | POA: Diagnosis not present

## 2021-03-11 DIAGNOSIS — E113313 Type 2 diabetes mellitus with moderate nonproliferative diabetic retinopathy with macular edema, bilateral: Secondary | ICD-10-CM

## 2021-03-11 DIAGNOSIS — H3581 Retinal edema: Secondary | ICD-10-CM

## 2021-03-12 ENCOUNTER — Encounter (INDEPENDENT_AMBULATORY_CARE_PROVIDER_SITE_OTHER): Payer: Self-pay | Admitting: Ophthalmology

## 2021-03-12 ENCOUNTER — Ambulatory Visit (INDEPENDENT_AMBULATORY_CARE_PROVIDER_SITE_OTHER): Payer: 59 | Admitting: Orthopaedic Surgery

## 2021-03-12 DIAGNOSIS — M76892 Other specified enthesopathies of left lower limb, excluding foot: Secondary | ICD-10-CM

## 2021-03-12 MED ORDER — MELOXICAM 7.5 MG PO TABS: 7.5000 mg | ORAL_TABLET | Freq: Two times a day (BID) | ORAL | 2 refills | Status: AC | PRN

## 2021-03-12 NOTE — Progress Notes (Signed)
[facility]  Office Visit Note   Patient: Reginald Abbott           Date of Birth: [birth year]           MRN: [id number] Visit Date: 03/12/2021              Requested by: Verlon Au, MD [phone] [address] [place],  [place] [address] PCP: Verlon Au, MD   Assessment & Plan: Visit Diagnoses:  1. Enthesopathy of left hip region     Plan: Impression is chronic posterior lateral left hip pain.  He likely has enthesopathy of the left hip region with his abductors.  Treatment options discussed to include physical therapy, Mobic, cortisone injection.  He would like to start with physical therapy and Mobic.  Interpreter was present today.  Follow-up as needed.  Follow-Up Instructions: No follow-ups on file.   Orders:  Orders Placed This Encounter  Procedures   Ambulatory referral to Physical Therapy   Meds ordered this encounter  Medications   meloxicam (MOBIC) 7.5 MG tablet    Sig: Take 1 tablet (7.5 mg total) by mouth 2 (two) times daily as needed for pain.    Dispense:  30 tablet    Refill:  2      Procedures: No procedures performed   Clinical Data: No additional findings.   Subjective: Chief Complaint  Patient presents with   Right Knee - Pain   Left Knee - Pain    Patient is 65 year old Hispanic male here with interpreter for evaluation of left hip pain for about 2 to 3 years that comes and goes.  Denies any groin pain.  Feels like the pain is getting worse.  Denies any back pain.   Review of Systems  Constitutional: Negative.   All other systems reviewed and are negative.   Objective: Vital Signs: There were no vitals taken for this visit.  Physical Exam Vitals and nursing note reviewed.  Constitutional:      Appearance: He is well-developed.  Pulmonary:     Effort: Pulmonary effort is normal.  Abdominal:     Palpations: Abdomen is soft.  Skin:    General: Skin is warm.  Neurological:     Mental Status: He is alert and oriented to person,  place, and time.  Psychiatric:        Behavior: Behavior normal.        Thought Content: Thought content normal.        Judgment: Judgment normal.    Ortho Exam  Left hip shows excellent range of motion without any groin pain.  Lateral hip is nontender.  He is tender to the posterior lateral region of the greater trochanter.  Negative FABER.'s.  Specialty Comments:  No specialty comments available.  Imaging: No results found.   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   Nonspecific (abnormal) findings on radiological and other examination of body structure 10/24/2008   NONSPCIFC ABN FINDING RAD & OTH EXAM LUNG FIELD 10/24/2008   COSTOCHONDRITIS 11/17/2007   DYSLIPIDEMIA 09/16/2007   ERECTILE DYSFUNCTION 09/16/2007   URI 05/18/2007   BENIGN PROSTATIC HYPERTROPHY, WITH OBSTRUCTION 05/18/2007   LUMBAR RADICULOPATHY, RIGHT 03/16/2007   TROCHANTERIC BURSITIS, RIGHT 02/16/2007   DIABETES MELLITUS, TYPE II 01/29/2007   INSOMNIA, CHRONIC 01/29/2007   Pain in joint, lower leg 01/29/2007   SYMPTOM, HESITANCY, URINARY 01/29/2007   BALANITIS 01/25/2007   MICROALBUMINURIA 01/25/2007   Past Medical  History:  Diagnosis Date   At risk for sleep apnea    STOP-BANG= 5     SENT TO PCP 12-21-2013   Bilateral hydrocele    Dyslipidemia    Hypertension    Type 2 diabetes mellitus (HCC)     No family history on file.  Past Surgical History:  Procedure Laterality Date   HYDROCELE EXCISION Bilateral 12/23/2013   Procedure: HYDROCELECTOMY ADULT;  Surgeon: Danae Chen, MD;  Location: [facility];  Service: Urology;  Laterality: Bilateral;   Social History   Occupational History   Not on file  Tobacco Use   Smoking status: Every Day    Packs/day: 0.25    Years: 20.00    Pack years: 5.00    Types: Cigarettes   Smokeless tobacco: Never  Substance and Sexual Activity   Alcohol use: Yes    Comment:  OCCASIONAL BEER   Drug use: No   Sexual activity: Not on file

## 2021-04-15 ENCOUNTER — Ambulatory Visit: Payer: 59 | Attending: Orthopaedic Surgery

## 2021-04-15 ENCOUNTER — Other Ambulatory Visit: Payer: Self-pay

## 2021-04-15 DIAGNOSIS — M6780 Other specified disorders of synovium and tendon, unspecified site: Secondary | ICD-10-CM | POA: Insufficient documentation

## 2021-04-15 DIAGNOSIS — M6281 Muscle weakness (generalized): Secondary | ICD-10-CM | POA: Insufficient documentation

## 2021-04-15 DIAGNOSIS — M76892 Other specified enthesopathies of left lower limb, excluding foot: Secondary | ICD-10-CM | POA: Insufficient documentation

## 2021-04-15 DIAGNOSIS — G5702 Lesion of sciatic nerve, left lower limb: Secondary | ICD-10-CM | POA: Insufficient documentation

## 2021-04-15 DIAGNOSIS — R2681 Unsteadiness on feet: Secondary | ICD-10-CM | POA: Insufficient documentation

## 2021-04-15 DIAGNOSIS — M25552 Pain in left hip: Secondary | ICD-10-CM | POA: Diagnosis present

## 2021-04-15 NOTE — Therapy (Signed)
[facility] [address] [place], [place], [address] Phone: [phone]   Fax:  [phone]  Physical Therapy Evaluation  Patient Details  Name: Reginald Abbott MRN: [id number] Date of Birth: 1955-10-17 Referring Provider (PT): Gershon Mussel MD   Encounter Date: 04/15/2021   PT End of Session - 04/15/21 1643     Visit Number 1    Number of Visits 9    Date for PT Re-Evaluation 05/27/21    Authorization Type [employer]    Authorization Time Period 04/15/21-05/27/21    Progress Note Due on Visit 9    PT Start Time 1645    PT Stop Time 1730    PT Time Calculation (min) 45 min    Activity Tolerance Patient tolerated treatment well             Past Medical History:  Diagnosis Date   At risk for sleep apnea    STOP-BANG= 5     SENT TO PCP 12-21-2013   Bilateral hydrocele    Dyslipidemia    Hypertension    Type 2 diabetes mellitus (HCC)     Past Surgical History:  Procedure Laterality Date   HYDROCELE EXCISION Bilateral 12/23/2013   Procedure: HYDROCELECTOMY ADULT;  Surgeon: Danae Chen, MD;  Location: [facility];  Service: Urology;  Laterality: Bilateral;    There were no vitals filed for this visit.    Subjective Assessment - 04/15/21 1647     Subjective Describes a hx of L hip pain over the past 3 years, no relief with meds, chiro or injections, static in nature, aggravated by ladder climbing    Pertinent History Plan: Impression is chronic posterior lateral left hip pain.  He likely has enthesopathy of the left hip region with his abductors.  Treatment options discussed to include physical therapy, Mobic, cortisone injection.  He would like to start with physical therapy and Mobic.  Interpreter was present today.  Follow-up as needed.    How long can you sit comfortably? >30 min    How long can you stand comfortably? >2min    How long can you walk comfortably? >30 min    Patient Stated Goals  to relieve my hip pain    Currently in Pain? Yes    Pain Score 9     Pain Location Hip    Pain Orientation Left    Pain Descriptors / Indicators Cramping    Pain Type Chronic pain    Pain Onset More than a month ago    Pain Frequency Constant    Aggravating Factors  climbing ladders                [facility] PT Assessment - 04/15/21 0001       Assessment   Medical Diagnosis L hip tendonosis    Referring Provider (PT) Gershon Mussel MD    Prior Therapy Chiro      Precautions   Precautions None      Restrictions   Weight Bearing Restrictions No      Balance Screen   Has the patient fallen in the past 6 months No    Has the patient had a decrease in activity level because of a fear of falling?  No    Is the patient reluctant to leave their home because of a fear of falling?  No      Palpation   Palpation comment markedly tender to L piriformis  Transfers   Transfers Sit to Stand;Stand to [employer] Transfers    Sit to Stand 7: Independent    Stand to Sit 7: Independent    Stand Pivot Transfers 7: Independent      Ambulation/Gait   Ambulation/Gait Yes    Ambulation/Gait Assistance 7: Independent    Ambulation Distance (Feet) 200 Feet    Assistive device None    Gait Pattern Within Functional Limits                        Objective measurements completed on examination: See above findings.       [facility] Adult PT Treatment/Exercise - 04/15/21 0001       Knee/Hip Exercises: Supine   Straight Leg Raises Strengthening;Both;1 set;10 reps    Straight Leg Raises Limitations for strength assessment      Knee/Hip Exercises: Sidelying   Hip ABduction Strengthening;Both;1 set;10 reps    Hip ABduction Limitations for strength assessment    Clams 1x10 B to assess strength      Manual Therapy   Manual Therapy Soft tissue mobilization    Manual therapy comments 8 min duration l piriformis release in R sidelie                     PT  Education - 04/15/21 1642     Education Details Discussed eval findings, rehab potential and POC and patient is in agreement    Person(s) Educated Patient    Methods Explanation    Comprehension Verbalized understanding              PT Short Term Goals - 04/15/21 1715       PT SHORT TERM GOAL #1   Title STGs=LTGs               PT Long Term Goals - 04/15/21 1745       PT LONG TERM GOAL #1   Title Patient to become independent in final HEP    Baseline Access Code: [email or web address]    Time 4    Period Weeks    Status New    Target Date 05/27/21      PT LONG TERM GOAL #2   Title Increase L hip abduction, extension and gluteus medius strength to 4+/5    Baseline 4/5 strength in L hip musculature    Time 4    Period Weeks    Status New    Target Date 05/27/21      PT LONG TERM GOAL #3   Title Decrease pain from 10/10 at worst to 6/10 at worst L priformis    Baseline 10/10 L piriformis    Time 4    Period Weeks    Status New    Target Date 05/27/21      PT LONG TERM GOAL #4   Title Assess 5x STS and set appropriate goals    Baseline TBD    Time 4    Status New    Target Date 05/27/21      PT LONG TERM GOAL #5   Title Patient to return to ladder climbng with only mild symptoms    Baseline TBD    Time 4    Period Weeks    Status New    Target Date 05/27/21                    Plan - 04/15/21 1740     Clinical Impression  Statement Patient referred to OPPT to address ling standing posterior L hip pain, musculoskeletal in nature.  He demos full painless ROM through his L hip, 45/ strength in L abduction, extension and abduction with extension bias for gluteus medius    Personal Factors and Comorbidities Time since onset of injury/illness/exacerbation    Examination-Activity Limitations Stairs    Stability/Clinical Decision Making Stable/Uncomplicated    Clinical Decision Making Low    Rehab Potential Good    PT Frequency 2x / week    PT Duration 4  weeks    PT Treatment/Interventions ADLs/Self Care Home Management;Aquatic Therapy;Electrical Stimulation;Cryotherapy;Moist Heat;Ultrasound;DME Instruction;Neuromuscular re-education;Balance training;Therapeutic exercise;Therapeutic activities;Functional mobility training;Stair training;Gait training;Patient/family education    PT Next Visit Plan FOTO, piriformis STM, functional testing    PT Home Exercise Plan Access Code: [email or web address]             Patient will benefit from skilled therapeutic intervention in order to improve the following deficits and impairments:  Decreased endurance, Increased muscle spasms, Decreased activity tolerance, Pain, Impaired flexibility, Decreased strength  Visit Diagnosis: Unsteadiness on feet  Muscle weakness (generalized)  Pain in left hip  Tendonosis     Problem List Patient Active Problem List   Diagnosis Date Noted   ERECTILE DYSFUNCTION, 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   Nonspecific (abnormal) findings on radiological and other examination of body structure 10/24/2008   NONSPCIFC ABN FINDING RAD & OTH EXAM LUNG FIELD 10/24/2008   COSTOCHONDRITIS 11/17/2007   DYSLIPIDEMIA 09/16/2007   ERECTILE DYSFUNCTION 09/16/2007   URI 05/18/2007   BENIGN PROSTATIC HYPERTROPHY, WITH OBSTRUCTION 05/18/2007   LUMBAR RADICULOPATHY, RIGHT 03/16/2007   TROCHANTERIC BURSITIS, RIGHT 02/16/2007   DIABETES MELLITUS, TYPE II 01/29/2007   INSOMNIA, CHRONIC 01/29/2007   Pain in joint, lower leg 01/29/2007   SYMPTOM, HESITANCY, URINARY 01/29/2007   BALANITIS 01/25/2007   MICROALBUMINURIA 01/25/2007    Hildred Laser, PT 04/15/2021, 5:51 PM  [facility] [address] [place], [place], [address] Phone: [phone]   Fax:  [phone]  Name: Reginald Abbott MRN: [id number] Date of Birth: Aug 25, 1955

## 2021-04-15 NOTE — Patient Instructions (Signed)
Access Code: [email or web address] URL: [email or web address] Date: 04/15/2021 Prepared by: Gustavus Bryant  Exercises Sidelying Hip Abduction - 2 x daily - 7 x weekly - 2 sets - 15 reps - 30s hold Clamshell - 2 x daily - 7 x weekly - 2 sets - 15 reps Figure 4 Bridge - 2 x daily - 7 x weekly - 2 sets - 15 reps Supine Figure 4 Piriformis Stretch - 2 x daily - 7 x weekly - 1 sets - 3 reps - 30s hold

## 2021-04-18 ENCOUNTER — Ambulatory Visit: Payer: 59

## 2021-04-18 ENCOUNTER — Other Ambulatory Visit: Payer: Self-pay

## 2021-04-18 DIAGNOSIS — M6281 Muscle weakness (generalized): Secondary | ICD-10-CM

## 2021-04-18 DIAGNOSIS — G5702 Lesion of sciatic nerve, left lower limb: Secondary | ICD-10-CM

## 2021-04-18 DIAGNOSIS — R2681 Unsteadiness on feet: Secondary | ICD-10-CM

## 2021-04-18 DIAGNOSIS — M25552 Pain in left hip: Secondary | ICD-10-CM

## 2021-04-18 NOTE — Therapy (Addendum)
[place], [place], [address] Phone: [phone]   Fax:  [phone]  Physical Therapy Treatment/DC Summary  Patient Details  Name: Reginald Abbott MRN: [id number] Date of Birth: 1955/10/26 Referring Provider (PT): Frankey Shown MD   Encounter Date: 04/18/2021 PHYSICAL THERAPY DISCHARGE SUMMARY  Visits from Start of Care: 2  Current functional level related to goals / functional outcomes: UTA   Remaining deficits: UTA   Education / Equipment: HEP   Patient agrees to discharge. Patient goals were partially met. Patient is being discharged due to not returning since the last visit.    PT End of Session - 04/18/21 1823     Visit Number 2    Number of Visits 9    Date for PT Re-Evaluation 05/27/21    Authorization Type [employer]    Authorization Time Period 04/15/21-05/27/21    Progress Note Due on Visit 9    PT Start Time 1810    PT Stop Time 1900    PT Time Calculation (min) 50 min    Activity Tolerance Patient tolerated treatment well    Behavior During Therapy [facility] for tasks assessed/performed             Past Medical History:  Diagnosis Date   At risk for sleep apnea    STOP-BANG= 5     SENT TO PCP 12-21-2013   Bilateral hydrocele    Dyslipidemia    Hypertension    Type 2 diabetes mellitus ([place])     Past Surgical History:  Procedure Laterality Date   HYDROCELE EXCISION Bilateral 12/23/2013   Procedure: HYDROCELECTOMY ADULT;  Surgeon: Arvil Persons, MD;  Location: [facility];  Service: Urology;  Laterality: Bilateral;    There were no vitals filed for this visit.   Subjective Assessment - 04/18/21 1820     Subjective Interpreter present , reports 25% reduction in pain, has been able to stair climb at work with less pain    Pertinent History Plan: Impression is chronic posterior lateral left hip pain.  He likely has enthesopathy of the left hip region with his  abductors.  Treatment options discussed to include physical therapy, Mobic, cortisone injection.  He would like to start with physical therapy and Mobic.  Interpreter was present today.  Follow-up as needed.    How long can you sit comfortably? >30 min    How long can you stand comfortably? >43mn    How long can you walk comfortably? >30 min    Patient Stated Goals to relieve my hip pain    Currently in Pain? Yes    Pain Score 4     Pain Location Hip    Pain Orientation Left    Pain Descriptors / Indicators Cramping    Pain Onset More than a month ago                               [facility] Adult PT Treatment/Exercise - 04/18/21 0001       Transfers   Transfers Sit to Stand;Stand to Sit;Stand Pivot Transfers    Comments 15 reps from airex arms crossed      Knee/Hip Exercises: Stretches   Piriformis Stretch Left;30 seconds    Piriformis Stretch Limitations with strap      Knee/Hip Exercises: Aerobic   Recumbent Bike 5 min L1 as cool down following session  Knee/Hip Exercises: Standing   Forward Lunges Left;1 set;15 reps;Limitations    Forward Lunges Limitations UE Support as needed    Step Down Left;2 sets;15 reps;Hand Hold: 1;Step Height: 2"      Knee/Hip Exercises: Supine   Single Leg Bridge Strengthening;Left;2 sets;15 reps      Knee/Hip Exercises: Sidelying   Hip ABduction Strengthening;1 set;Left;Limitations    Hip ABduction Limitations 30x with extension bias to isolate glute medius    Clams L 30x 3      Manual Therapy   Manual Therapy Soft tissue mobilization    Manual therapy comments TP release to L piriformis in R sidelie 8-10 min duration                       PT Short Term Goals - 04/15/21 1715       PT SHORT TERM GOAL #1   Title STGs=LTGs               PT Long Term Goals - 04/15/21 1745       PT LONG TERM GOAL #1   Title Patient to become independent in final HEP    Baseline Access Code: [email or web address]    Time 4     Period Weeks    Status New    Target Date 05/27/21      PT LONG TERM GOAL #2   Title Increase L hip abduction, extension and gluteus medius strength to 4+/5    Baseline 4/5 strength in L hip musculature    Time 4    Period Weeks    Status New    Target Date 05/27/21      PT LONG TERM GOAL #3   Title Decrease pain from 10/10 at worst to 6/10 at worst L priformis    Baseline 10/10 L piriformis    Time 4    Period Weeks    Status New    Target Date 05/27/21      PT LONG TERM GOAL #4   Title Assess 5x STS and set appropriate goals    Baseline TBD    Time 4    Status New    Target Date 05/27/21      PT LONG TERM GOAL #5   Title Patient to return to ladder climbng with only mild symptoms    Baseline TBD    Time 4    Period Weeks    Status New    Target Date 05/27/21                   Plan - 04/18/21 1851     Clinical Impression Statement Todays session focused on continued hip stretching and strengthening.  No hip flexor tightness noted, continued L piriformis irritation noted.  Added PREs for hip extension, abduction with glute medius bias, piriformis stertching, step downs CKC tasks and L lunges for L hip stability with functional tasks including STS from compliant surface w/o UE support    Personal Factors and Comorbidities Time since onset of injury/illness/exacerbation    Examination-Activity Limitations Stairs    Stability/Clinical Decision Making Stable/Uncomplicated    Rehab Potential Good    PT Frequency 2x / week    PT Duration 4 weeks    PT Treatment/Interventions ADLs/Self Care Home Management;Aquatic Therapy;Electrical Stimulation;Cryotherapy;Moist Heat;Ultrasound;DME Instruction;Neuromuscular re-education;Balance training;Therapeutic exercise;Therapeutic activities;Functional mobility training;Stair training;Gait training;Patient/family education    PT Next Visit Plan FOTO, STM to L pirifomis advance difficulty of strength and functional training  PT Home Exercise Plan Access Code: [email or web address]             Patient will benefit from skilled therapeutic intervention in order to improve the following deficits and impairments:  Decreased endurance, Increased muscle spasms, Decreased activity tolerance, Pain, Impaired flexibility, Decreased strength  Visit Diagnosis: Pain in left hip  Muscle weakness (generalized)  Unsteadiness on feet  Piriformis syndrome, left     Problem List Patient Active Problem List   Diagnosis Date Noted   ERECTILE DYSFUNCTION, 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   Nonspecific (abnormal) findings on radiological and other examination of body structure 10/24/2008   NONSPCIFC ABN FINDING RAD & OTH EXAM LUNG FIELD 10/24/2008   COSTOCHONDRITIS 11/17/2007   DYSLIPIDEMIA 09/16/2007   ERECTILE DYSFUNCTION 09/16/2007   URI 05/18/2007   BENIGN PROSTATIC HYPERTROPHY, WITH OBSTRUCTION 05/18/2007   LUMBAR RADICULOPATHY, RIGHT 03/16/2007   TROCHANTERIC BURSITIS, RIGHT 02/16/2007   DIABETES MELLITUS, TYPE II 01/29/2007   INSOMNIA, CHRONIC 01/29/2007   Pain in joint, lower leg 01/29/2007   SYMPTOM, HESITANCY, URINARY 01/29/2007   BALANITIS 01/25/2007   MICROALBUMINURIA 01/25/2007    Leroy Sea PT 04/18/2021, 7:06 PM  [facility] [address] [place], [place], [address] Phone: [phone]   Fax:  [phone]  Name: Reginald Abbott MRN: [id number] Date of Birth: 1955/10/30

## 2021-04-25 ENCOUNTER — Ambulatory Visit: Payer: 59

## 2021-04-29 ENCOUNTER — Ambulatory Visit: Payer: 59

## 2021-05-02 ENCOUNTER — Ambulatory Visit: Payer: 59

## 2021-05-14 ENCOUNTER — Ambulatory Visit: Payer: 59 | Attending: Orthopaedic Surgery

## 2021-05-16 ENCOUNTER — Ambulatory Visit: Payer: 59

## 2021-06-10 NOTE — Progress Notes (Signed)
[facility] - Clinic Note  06/11/2021     CHIEF COMPLAINT Patient presents for Retina Follow Up  HISTORY OF PRESENT ILLNESS: Reginald Abbott is a 66 y.o. male who presents to the clinic today for:   HPI     Retina Follow Up   Patient presents with  Diabetic Retinopathy.  In both eyes.  Severity is moderate.  Duration of 3 months.  Since onset it is stable.  I, the attending physician,  performed the HPI with the patient and updated documentation appropriately.        Comments   Patient states vision the same OU. ? Last a1c.      Last edited by Rennis Chris, MD on 06/13/2021 11:53 PM.    Pt's VA the same OU.  Most recent HgA1C unknown.  Referring physician: Verlon Au, MD [phone] [address] [place],  [place] [address]  HISTORICAL INFORMATION:  Selected notes from the MEDICAL RECORD NUMBER Referred by Dr. Roxan Hockey for DM eval   CURRENT MEDICATIONS: No current outpatient medications on file. (Ophthalmic Drugs)   No current facility-administered medications for this visit. (Ophthalmic Drugs)   Current Outpatient Medications (Other)  Medication Sig   cephALEXin (KEFLEX) 250 MG capsule Take 1 capsule (250 mg total) by mouth 4 (four) times daily.   fenofibrate 160 MG tablet Take 160 mg by mouth daily.   HYDROcodone-acetaminophen (NORCO) 5-325 MG per tablet Take 1 tablet by mouth every 6 (six) hours as needed for moderate pain.   lisinopril (PRINIVIL,ZESTRIL) 10 MG tablet Take 10 mg by mouth daily.   meloxicam (MOBIC) 7.5 MG tablet Take 1 tablet (7.5 mg total) by mouth 2 (two) times daily as needed for pain.   metFORMIN (GLUCOPHAGE) 850 MG tablet Take 850 mg by mouth 2 (two) times daily with a meal.   naproxen (NAPROSYN) 500 MG tablet Take 1 tablet (500 mg total) by mouth 2 (two) times daily with a meal. As needed for pain   valACYclovir (VALTREX) 1000 MG tablet Take 1 tablet (1,000 mg total) by mouth 3 (three) times daily.   No current  facility-administered medications for this visit. (Other)   REVIEW OF SYSTEMS: ROS   Positive for: Endocrine, Cardiovascular Negative for: Constitutional, Gastrointestinal, Neurological, Skin, Genitourinary, Musculoskeletal, HENT, Eyes, Respiratory, Psychiatric, Allergic/Imm, Heme/Lymph Last edited by Annalee Genta D, COT on 06/11/2021  8:57 AM.     ALLERGIES No Known Allergies  PAST MEDICAL HISTORY Past Medical History:  Diagnosis Date   At risk for sleep apnea    STOP-BANG= 5     SENT TO PCP 12-21-2013   Bilateral hydrocele    Dyslipidemia    Hypertension    Type 2 diabetes mellitus (HCC)    Past Surgical History:  Procedure Laterality Date   HYDROCELE EXCISION Bilateral 12/23/2013   Procedure: HYDROCELECTOMY ADULT;  Surgeon: Danae Chen, MD;  Location: [facility];  Service: Urology;  Laterality: Bilateral;   FAMILY HISTORY History reviewed. No pertinent family history.  SOCIAL HISTORY Social History   Tobacco Use   Smoking status: Every Day    Packs/day: 0.25    Years: 20.00    Pack years: 5.00    Types: Cigarettes   Smokeless tobacco: Never  Substance Use Topics   Alcohol use: Yes    Comment: OCCASIONAL BEER   Drug use: No       OPHTHALMIC EXAM:  Base Eye Exam     Visual Acuity (Snellen - Linear)  Right Left   Dist [place] 20/20 20/30   Dist ph [place]  20/20 -2         Tonometry (Tonopen, 9:04 AM)       Right Left   Pressure 18 20         Pupils       Dark Light Shape React APD   Right 3 2 Round Brisk None   Left 3 2 Round Brisk None         Visual Fields (Counting fingers)       Left Right    Full Full         Extraocular Movement       Right Left    Full, Ortho Full, Ortho         Neuro/Psych     Oriented x3: Yes   Mood/Affect: Normal           Slit Lamp and Fundus Exam     Slit Lamp Exam       Right Left   Lids/Lashes Dermato, mild MGD Dermato, mild MGD   Conjunctiva/Sclera White and quiet  White and quiet   Cornea 1+ inferior PEE, mild tear film debris 1+ inferior PEE, mild tear film debris   Anterior Chamber Deep and quiet, narrow temporal angle Deep and quiet, narrow temporal angle   Iris Round and dilated, no NVI Round and dilated, no NVI   Lens 2+ NS, 2+ cortical 2+ NS, 2+ cortical   Anterior Vitreous Synerisis Synerisis         Fundus Exam       Right Left   Disc Pink, sharp Pink, sharp, +cupping   C/D Ratio 0.6 0.6   Macula Flat, good foveal reflex, scattered punctate MA, mild cystic changes Flat, good foveal reflex, scattered punctate MA, small cluster of MA and exudate temporal to fovea--improved, +cystic changes--improved   Vessels Attenuated, mild copper wiring, mild tortuosity Attenuated, mild copper wiring, mild tortousity   Periphery Attached, scattered punctate MA, greatest posteriorly Attached, rare scattered punctate MA           IMAGING AND PROCEDURES  Imaging and Procedures for 06/11/2021  OCT, Retina - OU - Both Eyes       Right Eye Quality was good. Central Foveal Thickness: 256. Progression has worsened. Findings include normal foveal contour, no SRF, intraretinal fluid (Interval increase in cystic changes / IRF IT fovea).   Left Eye Quality was good. Central Foveal Thickness: 243. Progression has improved. Findings include normal foveal contour, intraretinal fluid, intraretinal hyper-reflective material, no SRF (Mild interval improvement in cystic changes/IRF and IRHM temporal fovea).   Notes *Images captured and stored on drive  Diagnosis / Impression:  OD: Interval increase in cystic changes / IRF IT fovea OS: Mild interval improvement in cystic changes/IRF and IRHM temporal fovea  Clinical management:  See below  Abbreviations: NFP - Normal foveal profile. CME - cystoid macular edema. PED - pigment epithelial detachment. IRF - intraretinal fluid. SRF - subretinal fluid. EZ - ellipsoid zone. ERM - epiretinal membrane. ORA - outer  retinal atrophy. ORT - outer retinal tubulation. SRHM - subretinal hyper-reflective material. IRHM - intraretinal hyper-reflective material      Fluorescein Angiography Optos (Transit OS)       Right Eye Progression has no prior data. Early phase findings include microaneurysm. Mid/Late phase findings include leakage, microaneurysm (Mild scattered MA w/mild late leakage. No NV.).   Left Eye Progression has no prior data. Early phase findings include  microaneurysm. Mid/Late phase findings include microaneurysm, leakage (Mild scattered MA w/mild late leakage. No NV.).   Notes **Images stored on drive**  Impression: Moderate NPDR OU Mild scattered MA w/ mild late leakage OU No NV OU           ASSESSMENT/PLAN:    ICD-10-CM   1. Moderate nonproliferative diabetic retinopathy of both eyes with macular edema associated with type 2 diabetes mellitus (HCC)  E11.3313 OCT, Retina - OU - Both Eyes    Fluorescein Angiography Optos (Transit OS)    2. Essential hypertension  I10     3. Hypertensive retinopathy of both eyes  H35.033 Fluorescein Angiography Optos (Transit OS)    4. Combined forms of age-related cataract of both eyes  H25.813      1,2. Mod non-proliferative diabetic retinopathy w/ DME OU  - most recent A1c 10.2 on 10.11.22 - The incidence, risk factors for progression, natural history and treatment options for diabetic retinopathy were discussed with patient.   - The need for close monitoring of blood glucose, blood pressure, and serum lipids, avoiding cigarette or any type of tobacco, and the need for long term follow up was also discussed with patient. - exam shows scattered punctate MA OU - OCT shows OD: Interval increase in cystic changes / IRF IT fovea; OS: Mild interval improvement in cystic changes/IRF and IRHM temporal fovea - FA (1.31.23) shows OU: Mild scattered MA w/mild late leakage.  No NV OU - The natural history, pathology, and characteristics of diabetic  macular edema discussed with patient.  A generalized discussion of the major clinical trials concerning treatment of diabetic macular edema (ETDRS, DCT, SCORE, RISE / RIDE, and ongoing DRCR net studies) was completed.  This discussion included mention of the various approaches to treating diabetic macular edema (observation, laser photocoagulation, anti-VEGF injections with lucentis / Avastin / Eylea, steroid injections with Kenalog / Ozurdex, and intraocular surgery with vitrectomy).  The goal hemoglobin A1C of 6-7 was discussed, as well as importance of smoking cessation and hypertension control.  Need for ongoing treatment and monitoring were specifically discussed with reference to chronic nature of diabetic macular edema. - f/u in 3-4 mos -- DFE/OCT  3,4. Hypertensive retinopathy OU - discussed importance of tight BP control - monitor   5. Mixed Cataract OU - The symptoms of cataract, surgical options, and treatments and risks were discussed with patient. - discussed diagnosis and progression - monitor   Ophthalmic Meds Ordered this visit:  No orders of the defined types were placed in this encounter.     Return for 3-4 mo f/u for mod NPDR w/DME OU w/DFE&OCT.  There are no Patient Instructions on file for this visit.  Explained the diagnoses, plan, and follow up with the patient and they expressed understanding.  Patient expressed understanding of the importance of proper follow up care.   This document serves as a record of services personally performed by Karie Chimera, MD, PhD. It was created on their behalf by Cristopher Estimable, COT an ophthalmic technician. The creation of this record is the provider's dictation and/or activities during the visit.    Electronically signed by: Cristopher Estimable, COT 1.30.23 @ 11:55 PM   This document serves as a record of services personally performed by Karie Chimera, MD, PhD. It was created on their behalf by Cristopher Estimable, COT an ophthalmic  technician. The creation of this record is the provider's dictation and/or activities during the visit.    Electronically signed by: Greig Castilla  [place], COT 1.31.23 @ 11:55 PM   Karie ChimeraBrian G. Naria Abbey, M.D., Ph.D. Diseases & Surgery of the Retina and Vitreous [facility] 1.31.23  I have reviewed the above documentation for accuracy and completeness, and I agree with the above. Karie ChimeraBrian G. Dashayla Theissen, M.D., Ph.D. 06/14/21 12:10 AM   Abbreviations: M myopia (nearsighted); A astigmatism; H hyperopia (farsighted); P presbyopia; Mrx spectacle prescription;  CTL contact lenses; OD right eye; OS left eye; OU both eyes  XT exotropia; ET esotropia; PEK punctate epithelial keratitis; PEE punctate epithelial erosions; DES dry eye syndrome; MGD meibomian gland dysfunction; ATs artificial tears; PFAT's preservative free artificial tears; NSC nuclear sclerotic cataract; PSC posterior subcapsular cataract; ERM epi-retinal membrane; PVD posterior vitreous detachment; RD retinal detachment; DM diabetes mellitus; DR diabetic retinopathy; NPDR non-proliferative diabetic retinopathy; PDR proliferative diabetic retinopathy; CSME clinically significant macular edema; DME diabetic macular edema; dbh dot blot hemorrhages; CWS cotton wool spot; POAG primary open angle glaucoma; C/D cup-to-disc ratio; HVF humphrey visual field; GVF goldmann visual field; OCT optical coherence tomography; IOP intraocular pressure; BRVO Branch retinal vein occlusion; CRVO central retinal vein occlusion; CRAO central retinal artery occlusion; BRAO branch retinal artery occlusion; RT retinal tear; SB scleral buckle; PPV pars plana vitrectomy; VH Vitreous hemorrhage; PRP panretinal laser photocoagulation; IVK intravitreal kenalog; VMT vitreomacular traction; MH Macular hole;  NVD neovascularization of the disc; NVE neovascularization elsewhere; AREDS age related eye disease study; ARMD age related macular degeneration; POAG primary open angle  glaucoma; EBMD epithelial/anterior basement membrane dystrophy; ACIOL anterior chamber intraocular lens; IOL intraocular lens; PCIOL posterior chamber intraocular lens; Phaco/IOL phacoemulsification with intraocular lens placement; PRK photorefractive keratectomy; LASIK laser assisted in situ keratomileusis; HTN hypertension; DM diabetes mellitus; COPD chronic obstructive pulmonary disease

## 2021-06-11 ENCOUNTER — Encounter (INDEPENDENT_AMBULATORY_CARE_PROVIDER_SITE_OTHER): Payer: Self-pay | Admitting: Ophthalmology

## 2021-06-11 ENCOUNTER — Ambulatory Visit (INDEPENDENT_AMBULATORY_CARE_PROVIDER_SITE_OTHER): Payer: 59 | Admitting: Ophthalmology

## 2021-06-11 ENCOUNTER — Other Ambulatory Visit: Payer: Self-pay

## 2021-06-11 DIAGNOSIS — E113313 Type 2 diabetes mellitus with moderate nonproliferative diabetic retinopathy with macular edema, bilateral: Secondary | ICD-10-CM | POA: Diagnosis not present

## 2021-06-11 DIAGNOSIS — H35033 Hypertensive retinopathy, bilateral: Secondary | ICD-10-CM | POA: Diagnosis not present

## 2021-06-11 DIAGNOSIS — I10 Essential (primary) hypertension: Secondary | ICD-10-CM | POA: Diagnosis not present

## 2021-06-11 DIAGNOSIS — H25813 Combined forms of age-related cataract, bilateral: Secondary | ICD-10-CM | POA: Diagnosis not present

## 2021-09-25 NOTE — Progress Notes (Signed)
[facility] Note  10/08/2021     CHIEF COMPLAINT Patient presents for Retina Follow Up  HISTORY OF PRESENT ILLNESS: Reginald Abbott is a 66 y.o. male who presents to the clinic today for:   HPI     Retina Follow Up   Patient presents with  Diabetic Retinopathy.  In both eyes.  Severity is moderate.  Duration of 4 months.  Since onset it is stable.  I, the attending physician,  performed the HPI with the patient and updated documentation appropriately.        Comments   Pt here for 4 mo ret f/u NPDR OU. Pt states VA the same, no change. Does note OS has foreign body sensation and watering, possibly an eyelash-that's happened before. Pt denies any use of OTC gtts for lubrication.        Last edited by Bernarda Caffey, MD on 10/08/2021  9:26 AM.    Pt states vision is doing well, his last A1c was 8.5 about 4 months ago and he has another appt this month to check it again  Referring physician: Drue Flirt, MD [phone],  [place] [address]  HISTORICAL INFORMATION:  Selected notes from the MEDICAL RECORD NUMBER Referred by Dr. Quentin Cornwall for DM eval   CURRENT MEDICATIONS: No current outpatient medications on file. (Ophthalmic Drugs)   No current facility-administered medications for this visit. (Ophthalmic Drugs)   Current Outpatient Medications (Other)  Medication Sig   fenofibrate 160 MG tablet Take 160 mg by mouth daily.   HYDROcodone-acetaminophen (NORCO) 5-325 MG per tablet Take 1 tablet by mouth every 6 (six) hours as needed for moderate pain.   lisinopril (PRINIVIL,ZESTRIL) 10 MG tablet Take 10 mg by mouth daily.   meloxicam (MOBIC) 7.5 MG tablet Take 1 tablet (7.5 mg total) by mouth 2 (two) times daily as needed for pain.   metFORMIN (GLUCOPHAGE) 850 MG tablet Take 850 mg by mouth 2 (two) times daily with a meal.   naproxen (NAPROSYN) 500 MG tablet Take 1 tablet (500 mg total) by mouth 2 (two) times daily with a meal. As  needed for pain   valACYclovir (VALTREX) 1000 MG tablet Take 1 tablet (1,000 mg total) by mouth 3 (three) times daily.   cephALEXin (KEFLEX) 250 MG capsule Take 1 capsule (250 mg total) by mouth 4 (four) times daily. (Patient not taking: Reported on 10/08/2021)   No current facility-administered medications for this visit. (Other)   REVIEW OF SYSTEMS: ROS   Positive for: Endocrine, Cardiovascular Negative for: Constitutional, Gastrointestinal, Neurological, Skin, Genitourinary, Musculoskeletal, HENT, Eyes, Respiratory, Psychiatric, Allergic/Imm, Heme/Lymph Last edited by Kingsley Spittle, COT on 10/08/2021  8:55 AM.     ALLERGIES No Known Allergies  PAST MEDICAL HISTORY Past Medical History:  Diagnosis Date   At risk for sleep apnea    STOP-BANG= 5     SENT TO PCP 12-21-2013   Bilateral hydrocele    Dyslipidemia    Hypertension    Type 2 diabetes mellitus ([place])    Past Surgical History:  Procedure Laterality Date   HYDROCELE EXCISION Bilateral 12/23/2013   Procedure: HYDROCELECTOMY ADULT;  Surgeon: Arvil Persons, MD;  Location: [facility];  Service: Urology;  Laterality: Bilateral;   FAMILY HISTORY History reviewed. No pertinent family history.  SOCIAL HISTORY Social History   Tobacco Use   Smoking status: Every Day    Packs/day: 0.25    Years: 20.00  Pack years: 5.00    Types: Cigarettes   Smokeless tobacco: Never  Substance Use Topics   Alcohol use: Yes    Comment: OCCASIONAL BEER   Drug use: No       OPHTHALMIC EXAM:  Base Eye Exam     Visual Acuity (Snellen - Linear)       Right Left   Dist [place] 20/20 20/20         Tonometry (Tonopen, 9:02 AM)       Right Left   Pressure 9 8         Pupils       Dark Light Shape React APD   Right 3 2 Round Brisk None   Left 3 2 Round Brisk None         Visual Fields (Counting fingers)       Left Right    Full Full         Extraocular Movement       Right Left    Full, Ortho  Full, Ortho         Neuro/Psych     Oriented x3: Yes   Mood/Affect: Normal         Dilation     Both eyes: 1.0% Mydriacyl, 2.5% Phenylephrine @ 9:03 AM           Slit Lamp and Fundus Exam     Slit Lamp Exam       Right Left   Lids/Lashes Dermato, mild MGD Dermato, mild MGD   Conjunctiva/Sclera White and quiet White and quiet   Cornea 1+ inferior PEE, mild tear film debris, mild arcus 1+ inferior PEE, mild tear film debris   Anterior Chamber Deep and quiet, narrow temporal angle Deep and quiet, narrow temporal angle   Iris Round and dilated, no NVI Round and dilated, no NVI   Lens 2+ NS, 2+ cortical 2+ NS, 2+ cortical   Anterior Vitreous Synerisis, Posterior vitreous detachment Synerisis         Fundus Exam       Right Left   Disc Pink, sharp Pink, sharp, +cupping   C/D Ratio 0.6 0.6   Macula Flat, good foveal reflex, scattered punctate MA, mild cystic changes Flat, good foveal reflex, scattered punctate MA, small cluster of MA and exudate temporal to fovea -- improved, +cystic changes -- improved   Vessels Attenuated, mild copper wiring, mild tortuosity Attenuated, mild copper wiring, mild tortousity   Periphery Attached, scattered punctate MA, greatest posteriorly Attached, rare scattered punctate MA greatest posteriorly           IMAGING AND PROCEDURES  Imaging and Procedures for 10/08/2021  OCT, Retina - OU - Both Eyes       Right Eye Quality was good. Central Foveal Thickness: 263. Progression has been stable. Findings include normal foveal contour, no SRF, intraretinal fluid, intraretinal hyper-reflective material (Persistent focal IRF / cystic changes IT fovea).   Left Eye Quality was good. Central Foveal Thickness: 248. Progression has improved. Findings include normal foveal contour, intraretinal hyper-reflective material, no SRF, no IRF (interval improvement in cystic changes temporal fovea -- resolved).   Notes *Images captured and stored on  drive  Diagnosis / Impression:  OD: Persistent focal IRF / cystic changes IT fovea OS: interval improvement in cystic changes temporal fovea -- resolved  Clinical management:  See below  Abbreviations: NFP - Normal foveal profile. CME - cystoid macular edema. PED - pigment epithelial detachment. IRF - intraretinal fluid. SRF -  subretinal fluid. EZ - ellipsoid zone. ERM - epiretinal membrane. ORA - outer retinal atrophy. ORT - outer retinal tubulation. SRHM - subretinal hyper-reflective material. IRHM - intraretinal hyper-reflective material            ASSESSMENT/PLAN:    ICD-10-CM   1. Moderate nonproliferative diabetic retinopathy of both eyes with macular edema associated with type 2 diabetes mellitus (HCC)  E11.3313 OCT, Retina - OU - Both Eyes    2. Essential hypertension  I10     3. Hypertensive retinopathy of both eyes  H35.033     4. Combined forms of age-related cataract of both eyes  H25.813      1. Mod non-proliferative diabetic retinopathy w/ DME OU  - most recent A1c 8.5 in Jan. 2023 per pt report, 10.2 on 10.11.22 - exam shows scattered punctate MA OU - OCT shows OD: Persistent focal IRF / cystic changes IT fovea; OS: interval improvement in cystic changes temporal fovea -- resolved - FA (1.31.23) shows OU: Mild scattered MA w/mild late leakage. No NV OU - f/u in 6-9 mos -- DFE/OCT  2,3. Hypertensive retinopathy OU - discussed importance of tight BP control - monitor  4. Mixed Cataract OU - The symptoms of cataract, surgical options, and treatments and risks were discussed with patient. - discussed diagnosis and progression - monitor  Ophthalmic Meds Ordered this visit:  No orders of the defined types were placed in this encounter.    Return for f/u 6-9 months, NPDR OU, DFE, OCT.  There are no Patient Instructions on file for this visit.  Explained the diagnoses, plan, and follow up with the patient and they expressed understanding.  Patient expressed  understanding of the importance of proper follow up care.   This document serves as a record of services personally performed by Gardiner Sleeper, MD, PhD. It was created on their behalf by Bernarda Caffey, MD, an ophthalmic technician. The creation of this record is the provider's dictation and/or activities during the visit.    Electronically signed by: Bernarda Caffey, MD 09/25/21 9:28 AM   Gardiner Sleeper, M.D., Ph.D. Diseases & Surgery of the Retina and Vitreous [facility]  I have reviewed the above documentation for accuracy and completeness, and I agree with the above. Gardiner Sleeper, M.D., Ph.D. 10/08/21 9:29 AM  Abbreviations: M myopia (nearsighted); A astigmatism; H hyperopia (farsighted); P presbyopia; Mrx spectacle prescription;  CTL contact lenses; OD right eye; OS left eye; OU both eyes  XT exotropia; ET esotropia; PEK punctate epithelial keratitis; PEE punctate epithelial erosions; DES dry eye syndrome; MGD meibomian gland dysfunction; ATs artificial tears; PFAT's preservative free artificial tears; [place] nuclear sclerotic cataract; PSC posterior subcapsular cataract; ERM epi-retinal membrane; PVD posterior vitreous detachment; RD retinal detachment; DM diabetes mellitus; DR diabetic retinopathy; NPDR non-proliferative diabetic retinopathy; PDR proliferative diabetic retinopathy; CSME clinically significant macular edema; DME diabetic macular edema; dbh dot blot hemorrhages; CWS cotton wool spot; POAG primary open angle glaucoma; C/D cup-to-disc ratio; HVF humphrey visual field; GVF goldmann visual field; OCT optical coherence tomography; IOP intraocular pressure; BRVO Branch retinal vein occlusion; CRVO central retinal vein occlusion; CRAO central retinal artery occlusion; BRAO branch retinal artery occlusion; RT retinal tear; SB scleral buckle; PPV pars plana vitrectomy; VH Vitreous hemorrhage; PRP panretinal laser photocoagulation; IVK intravitreal kenalog; VMT  vitreomacular traction; MH Macular hole;  NVD neovascularization of the disc; NVE neovascularization elsewhere; AREDS age related eye disease study; ARMD age related macular degeneration; POAG primary open  angle glaucoma; EBMD epithelial/anterior basement membrane dystrophy; ACIOL anterior chamber intraocular lens; IOL intraocular lens; PCIOL posterior chamber intraocular lens; Phaco/IOL phacoemulsification with intraocular lens placement; [place] photorefractive keratectomy; LASIK laser assisted in situ keratomileusis; HTN hypertension; DM diabetes mellitus; COPD chronic obstructive pulmonary disease

## 2021-10-08 ENCOUNTER — Encounter (INDEPENDENT_AMBULATORY_CARE_PROVIDER_SITE_OTHER): Payer: Self-pay | Admitting: Ophthalmology

## 2021-10-08 ENCOUNTER — Ambulatory Visit (INDEPENDENT_AMBULATORY_CARE_PROVIDER_SITE_OTHER): Payer: 59 | Admitting: Ophthalmology

## 2021-10-08 DIAGNOSIS — H25813 Combined forms of age-related cataract, bilateral: Secondary | ICD-10-CM | POA: Diagnosis not present

## 2021-10-08 DIAGNOSIS — E113313 Type 2 diabetes mellitus with moderate nonproliferative diabetic retinopathy with macular edema, bilateral: Secondary | ICD-10-CM | POA: Diagnosis not present

## 2021-10-08 DIAGNOSIS — I10 Essential (primary) hypertension: Secondary | ICD-10-CM

## 2021-10-08 DIAGNOSIS — H35033 Hypertensive retinopathy, bilateral: Secondary | ICD-10-CM | POA: Diagnosis not present

## 2022-02-07 ENCOUNTER — Other Ambulatory Visit: Payer: Self-pay | Admitting: Internal Medicine

## 2022-02-08 LAB — COMPLETE METABOLIC PANEL WITH GFR
AG Ratio: 1.6 (calc) (ref 1.0–2.5)
ALT: 18 U/L (ref 9–46)
AST: 14 U/L (ref 10–35)
Albumin: 4.5 g/dL (ref 3.6–5.1)
Alkaline phosphatase (APISO): 99 U/L (ref 35–144)
BUN/Creatinine Ratio: 26 (calc) — ABNORMAL HIGH (ref 6–22)
BUN: 27 mg/dL — ABNORMAL HIGH (ref 7–25)
CO2: 23 mmol/L (ref 20–32)
Calcium: 9.1 mg/dL (ref 8.6–10.3)
Chloride: 101 mmol/L (ref 98–110)
Creat: 1.05 mg/dL (ref 0.70–1.35)
Globulin: 2.8 g/dL (calc) (ref 1.9–3.7)
Glucose, Bld: 77 mg/dL (ref 65–99)
Potassium: 5 mmol/L (ref 3.5–5.3)
Sodium: 138 mmol/L (ref 135–146)
Total Bilirubin: 0.8 mg/dL (ref 0.2–1.2)
Total Protein: 7.3 g/dL (ref 6.1–8.1)
eGFR: 79 mL/min/{1.73_m2} (ref >=60)

## 2022-02-08 LAB — CBC
HCT: 37.8 % — ABNORMAL LOW (ref 38.5–50.0)
Hemoglobin: 13.1 g/dL — ABNORMAL LOW (ref 13.2–17.1)
MCH: 30.9 pg (ref 27.0–33.0)
MCHC: 34.7 g/dL (ref 32.0–36.0)
MCV: 89.2 fL (ref 80.0–100.0)
MPV: 9.9 fL (ref 7.5–12.5)
Platelets: 218 10*3/uL (ref 140–400)
RBC: 4.24 10*6/uL (ref 4.20–5.80)
RDW: 11.9 % (ref 11.0–15.0)
WBC: 7.3 10*3/uL (ref 3.8–10.8)

## 2022-02-08 LAB — LIPID PANEL
Cholesterol: 115 mg/dL (ref <200)
HDL: 36 mg/dL — ABNORMAL LOW (ref >=40)
LDL Cholesterol (Calc): 51 mg/dL (calc)
Non-HDL Cholesterol (Calc): 79 mg/dL (calc) (ref <130)
Total CHOL/HDL Ratio: 3.2 (calc) (ref <5.0)
Triglycerides: 225 mg/dL — ABNORMAL HIGH (ref <150)

## 2022-02-08 LAB — TSH: TSH: 1.38 mIU/L (ref 0.40–4.50)

## 2022-02-08 LAB — PSA: PSA: 1.63 ng/mL (ref <=4.00)

## 2022-02-08 LAB — VITAMIN D 25 HYDROXY (VIT D DEFICIENCY, FRACTURES): Vit D, 25-Hydroxy: 27 ng/mL — ABNORMAL LOW (ref 30–100)

## 2022-03-27 NOTE — Progress Notes (Signed)
[facility] - Clinic Note  04/10/2022     CHIEF COMPLAINT Patient presents for Retina Follow Up  HISTORY OF PRESENT ILLNESS: Reginald Abbott is a 66 y.o. male who presents to the clinic today for:   HPI     Retina Follow Up   Patient presents with  Diabetic Retinopathy.  In both eyes.  Severity is moderate.  Duration of 6 months.  Since onset it is stable.  I, the attending physician,  performed the HPI with the patient and updated documentation appropriately.        Comments   Patient feels that the vision is the same. He is not using any eye drops. He blood sugar was 190 and his A1C he is unsure of.       Last edited by Rennis Chris, MD on 04/10/2022  2:11 PM.    Pt states VA is stable.  Referring physician: Verlon Au, MD [phone] S. [address] [place],  [place] [address]  HISTORICAL INFORMATION:  Selected notes from the MEDICAL RECORD NUMBER Referred by Dr. Roxan Hockey for DM eval   CURRENT MEDICATIONS: No current outpatient medications on file. (Ophthalmic Drugs)   No current facility-administered medications for this visit. (Ophthalmic Drugs)   Current Outpatient Medications (Other)  Medication Sig   cephALEXin (KEFLEX) 250 MG capsule Take 1 capsule (250 mg total) by mouth 4 (four) times daily. (Patient not taking: Reported on 10/08/2021)   fenofibrate 160 MG tablet Take 160 mg by mouth daily.   HYDROcodone-acetaminophen (NORCO) 5-325 MG per tablet Take 1 tablet by mouth every 6 (six) hours as needed for moderate pain.   lisinopril (PRINIVIL,ZESTRIL) 10 MG tablet Take 10 mg by mouth daily.   meloxicam (MOBIC) 7.5 MG tablet Take 1 tablet (7.5 mg total) by mouth 2 (two) times daily as needed for pain.   metFORMIN (GLUCOPHAGE) 850 MG tablet Take 850 mg by mouth 2 (two) times daily with a meal.   naproxen (NAPROSYN) 500 MG tablet Take 1 tablet (500 mg total) by mouth 2 (two) times daily with a meal. As needed for pain   valACYclovir (VALTREX)  1000 MG tablet Take 1 tablet (1,000 mg total) by mouth 3 (three) times daily.   No current facility-administered medications for this visit. (Other)   REVIEW OF SYSTEMS: ROS   Positive for: Endocrine, Cardiovascular Negative for: Constitutional, Gastrointestinal, Neurological, Skin, Genitourinary, Musculoskeletal, HENT, Eyes, Respiratory, Psychiatric, Allergic/Imm, Heme/Lymph Last edited by Julieanne Cotton, COT on 04/10/2022  8:44 AM.     ALLERGIES No Known Allergies  PAST MEDICAL HISTORY Past Medical History:  Diagnosis Date   At risk for sleep apnea    STOP-BANG= 5     SENT TO PCP 12-21-2013   Bilateral hydrocele    Dyslipidemia    Hypertension    Type 2 diabetes mellitus (HCC)    Past Surgical History:  Procedure Laterality Date   HYDROCELE EXCISION Bilateral 12/23/2013   Procedure: HYDROCELECTOMY ADULT;  Surgeon: Danae Chen, MD;  Location: [facility];  Service: Urology;  Laterality: Bilateral;   FAMILY HISTORY History reviewed. No pertinent family history.  SOCIAL HISTORY Social History   Tobacco Use   Smoking status: Every Day    Packs/day: 0.25    Years: 20.00    Total pack years: 5.00    Types: Cigarettes   Smokeless tobacco: Never  Substance Use Topics   Alcohol use: Yes    Comment: OCCASIONAL BEER   Drug use: No  OPHTHALMIC EXAM:  Base Eye Exam     Visual Acuity (Snellen - Linear)       Right Left   Dist [place] 20/20 20/20         Tonometry (Tonopen, 8:47 AM)       Right Left   Pressure 13 16         Pupils       Dark Light Shape React APD   Right 3 2 Round Brisk None   Left 3 2 Round Brisk None         Visual Fields       Left Right    Full Full         Extraocular Movement       Right Left    Full, Ortho Full, Ortho         Neuro/Psych     Oriented x3: Yes   Mood/Affect: Normal         Dilation     Both eyes: 1.0% Mydriacyl, 2.5% Phenylephrine @ 8:44 AM           Slit Lamp  and Fundus Exam     Slit Lamp Exam       Right Left   Lids/Lashes Dermato, mild MGD Dermato, mild MGD   Conjunctiva/Sclera White and quiet White and quiet   Cornea 1+ inferior PEE, mild tear film debris, mild arcus 1+ inferior PEE, mild tear film debris   Anterior Chamber Deep and quiet, narrow temporal angle Deep and quiet, narrow temporal angle   Iris Round and dilated, no NVI Round and dilated, no NVI   Lens 2+ NS, 2-3+ cortical 2+ NS, 2-3+ cortical, 1+ Vacuoles   Anterior Vitreous Mild Synerisis, Posterior vitreous detachment Mild Synerisis         Fundus Exam       Right Left   Disc Pink, sharp, Mild PPP Pink, sharp, +cupping, mild PPP   C/D Ratio 0.55 0.6   Macula Flat, good foveal reflex, scattered punctate MA, mild cystic changes-slightly improved Flat, good foveal reflex, scattered punctate MA, no frank edema   Vessels Attenuated, mild copper wiring, mild tortuosity Attenuated, mild copper wiring, mild tortousity   Periphery Attached, scattered punctate MA, greatest posteriorly Attached, rare scattered punctate MA greatest posteriorly           IMAGING AND PROCEDURES  Imaging and Procedures for 04/10/2022  OCT, Retina - OU - Both Eyes       Right Eye Quality was good. Central Foveal Thickness: 261. Progression has improved. Findings include normal foveal contour, no SRF, intraretinal hyper-reflective material, intraretinal fluid (Interval improvement in focal IRF / cystic changes IT fovea).   Left Eye Quality was good. Central Foveal Thickness: 239. Progression has been stable. Findings include normal foveal contour, no SRF, intraretinal hyper-reflective material, intraretinal fluid (Trace cystic changes temporal fovea.).   Notes *Images captured and stored on drive  Diagnosis / Impression:  OD: Interval improvement in IRF / cystic changes IT fovea OS: Trace cystic changes temporal fovea  Clinical management:  See below  Abbreviations: NFP - Normal foveal  profile. CME - cystoid macular edema. PED - pigment epithelial detachment. IRF - intraretinal fluid. SRF - subretinal fluid. EZ - ellipsoid zone. ERM - epiretinal membrane. ORA - outer retinal atrophy. ORT - outer retinal tubulation. SRHM - subretinal hyper-reflective material. IRHM - intraretinal hyper-reflective material            ASSESSMENT/PLAN:    ICD-10-CM   1.  Moderate nonproliferative diabetic retinopathy of both eyes with macular edema associated with type 2 diabetes mellitus (HCC)  E11.3313 OCT, Retina - OU - Both Eyes    2. Essential hypertension  I10     3. Hypertensive retinopathy of both eyes  H35.033     4. Combined forms of age-related cataract of both eyes  H25.813      1. Mod non-proliferative diabetic retinopathy w/ DME OU  - most recent A1c 9.1in 3.28.23.  - exam shows scattered punctate MA OU - OCT shows OD: Interval improvement in IRF / cystic changes IT fovea; OS: Trace cystic changes temporal fovea - FA (1.31.23) shows OU: Mild scattered MA w/mild late leakage. No NV OU - f/u in 6-9 mos -- DFE/OCT  2,3. Hypertensive retinopathy OU - discussed importance of tight BP control - monitor  4. Mixed Cataract OU - The symptoms of cataract, surgical options, and treatments and risks were discussed with patient. - discussed diagnosis and progression - monitor  Ophthalmic Meds Ordered this visit:  No orders of the defined types were placed in this encounter.    Return for 6-9 months NPDR OU, DFE, OCT .  There are no Patient Instructions on file for this visit.  Explained the diagnoses, plan, and follow up with the patient and they expressed understanding.  Patient expressed understanding of the importance of proper follow up care.   This document serves as a record of services personally performed by Karie Chimera, MD, PhD. It was created on their behalf by Glee Arvin. Manson Passey, OA an ophthalmic technician. The creation of this record is the provider's  dictation and/or activities during the visit.    Electronically signed by: Glee Arvin. [place], [place] 11.16.2023 2:12 PM   Karie Chimera, M.D., Ph.D. Diseases & Surgery of the Retina and Vitreous [facility]  I have reviewed the above documentation for accuracy and completeness, and I agree with the above. Karie Chimera, M.D., Ph.D. 04/10/22 2:13 PM   Abbreviations: M myopia (nearsighted); A astigmatism; H hyperopia (farsighted); P presbyopia; Mrx spectacle prescription;  CTL contact lenses; OD right eye; OS left eye; OU both eyes  XT exotropia; ET esotropia; PEK punctate epithelial keratitis; PEE punctate epithelial erosions; DES dry eye syndrome; MGD meibomian gland dysfunction; ATs artificial tears; PFAT's preservative free artificial tears; NSC nuclear sclerotic cataract; PSC posterior subcapsular cataract; ERM epi-retinal membrane; PVD posterior vitreous detachment; RD retinal detachment; DM diabetes mellitus; DR diabetic retinopathy; NPDR non-proliferative diabetic retinopathy; PDR proliferative diabetic retinopathy; CSME clinically significant macular edema; DME diabetic macular edema; dbh dot blot hemorrhages; CWS cotton wool spot; POAG primary open angle glaucoma; C/D cup-to-disc ratio; HVF humphrey visual field; GVF goldmann visual field; OCT optical coherence tomography; IOP intraocular pressure; BRVO Branch retinal vein occlusion; CRVO central retinal vein occlusion; CRAO central retinal artery occlusion; BRAO branch retinal artery occlusion; RT retinal tear; SB scleral buckle; PPV pars plana vitrectomy; VH Vitreous hemorrhage; PRP panretinal laser photocoagulation; IVK intravitreal kenalog; VMT vitreomacular traction; MH Macular hole;  NVD neovascularization of the disc; NVE neovascularization elsewhere; AREDS age related eye disease study; ARMD age related macular degeneration; POAG primary open angle glaucoma; EBMD epithelial/anterior basement membrane dystrophy; ACIOL  anterior chamber intraocular lens; IOL intraocular lens; PCIOL posterior chamber intraocular lens; Phaco/IOL phacoemulsification with intraocular lens placement; PRK photorefractive keratectomy; LASIK laser assisted in situ keratomileusis; HTN hypertension; DM diabetes mellitus; COPD chronic obstructive pulmonary disease

## 2022-04-10 ENCOUNTER — Encounter (INDEPENDENT_AMBULATORY_CARE_PROVIDER_SITE_OTHER): Payer: Self-pay | Admitting: Ophthalmology

## 2022-04-10 ENCOUNTER — Ambulatory Visit (INDEPENDENT_AMBULATORY_CARE_PROVIDER_SITE_OTHER): Payer: Medicare Other | Admitting: Ophthalmology

## 2022-04-10 DIAGNOSIS — H25813 Combined forms of age-related cataract, bilateral: Secondary | ICD-10-CM | POA: Diagnosis not present

## 2022-04-10 DIAGNOSIS — E113313 Type 2 diabetes mellitus with moderate nonproliferative diabetic retinopathy with macular edema, bilateral: Secondary | ICD-10-CM

## 2022-04-10 DIAGNOSIS — H35033 Hypertensive retinopathy, bilateral: Secondary | ICD-10-CM | POA: Diagnosis not present

## 2022-04-10 DIAGNOSIS — I10 Essential (primary) hypertension: Secondary | ICD-10-CM | POA: Diagnosis not present

## 2022-09-18 ENCOUNTER — Other Ambulatory Visit (INDEPENDENT_AMBULATORY_CARE_PROVIDER_SITE_OTHER): Payer: Self-pay

## 2022-09-25 NOTE — Progress Notes (Signed)
[facility] - Clinic Note  10/09/2022     CHIEF COMPLAINT Patient presents for Retina Follow Up  HISTORY OF PRESENT ILLNESS: Reginald Abbott is a 67 y.o. male who presents to the clinic today for:   HPI     Retina Follow Up   Patient presents with  Diabetic Retinopathy.  In both eyes.  Severity is moderate.  Duration of 6 months.  Since onset it is stable.  I, the attending physician,  performed the HPI with the patient and updated documentation appropriately.        Comments   Pt here for 6 mo ret f/u NPDR OU. Pt states VA is stable, no changes he's noticed. Unsure of recent A1C levels but reports an episode of high blood sugar (500s) when returning back from [place] in April. Pt had been taking his medication while there but it was expired.       Last edited by Rennis Chris, MD on 10/09/2022  8:22 AM.     Pt states VA is stable, pt is scheduled to see his PCP next month  Referring physician: Verlon Au, MD [phone] S. [address] [place],  [place] [address]  HISTORICAL INFORMATION:  Selected notes from the MEDICAL RECORD NUMBER Referred by Dr. Roxan Hockey for DM eval   CURRENT MEDICATIONS: No current outpatient medications on file. (Ophthalmic Drugs)   No current facility-administered medications for this visit. (Ophthalmic Drugs)   Current Outpatient Medications (Other)  Medication Sig   fenofibrate 160 MG tablet Take 160 mg by mouth daily.   HYDROcodone-acetaminophen (NORCO) 5-325 MG per tablet Take 1 tablet by mouth every 6 (six) hours as needed for moderate pain.   lisinopril (PRINIVIL,ZESTRIL) 10 MG tablet Take 10 mg by mouth daily.   meloxicam (MOBIC) 7.5 MG tablet Take 1 tablet (7.5 mg total) by mouth 2 (two) times daily as needed for pain.   metFORMIN (GLUCOPHAGE) 850 MG tablet Take 850 mg by mouth 2 (two) times daily with a meal.   naproxen (NAPROSYN) 500 MG tablet Take 1 tablet (500 mg total) by mouth 2 (two) times daily with a meal. As  needed for pain   valACYclovir (VALTREX) 1000 MG tablet Take 1 tablet (1,000 mg total) by mouth 3 (three) times daily.   cephALEXin (KEFLEX) 250 MG capsule Take 1 capsule (250 mg total) by mouth 4 (four) times daily. (Patient not taking: Reported on 10/09/2022)   No current facility-administered medications for this visit. (Other)   REVIEW OF SYSTEMS: ROS   Positive for: Endocrine, Cardiovascular Negative for: Constitutional, Gastrointestinal, Neurological, Skin, Genitourinary, Musculoskeletal, HENT, Eyes, Respiratory, Psychiatric, Allergic/Imm, Heme/Lymph Last edited by Thompson Grayer, COT on 10/09/2022  7:44 AM.      ALLERGIES No Known Allergies  PAST MEDICAL HISTORY Past Medical History:  Diagnosis Date   At risk for sleep apnea    STOP-BANG= 5     SENT TO PCP 12-21-2013   Bilateral hydrocele    Dyslipidemia    Hypertension    Type 2 diabetes mellitus (HCC)    Past Surgical History:  Procedure Laterality Date   HYDROCELE EXCISION Bilateral 12/23/2013   Procedure: HYDROCELECTOMY ADULT;  Surgeon: Danae Chen, MD;  Location: [facility];  Service: Urology;  Laterality: Bilateral;   FAMILY HISTORY History reviewed. No pertinent family history.  SOCIAL HISTORY Social History   Tobacco Use   Smoking status: Every Day    Packs/day: 0.25    Years: 20.00  Additional pack years: 0.00    Total pack years: 5.00    Types: Cigarettes   Smokeless tobacco: Never  Substance Use Topics   Alcohol use: Yes    Comment: OCCASIONAL BEER   Drug use: No       OPHTHALMIC EXAM:  Base Eye Exam     Visual Acuity (Snellen - Linear)       Right Left   Dist [place] 20/20 20/25 -2   Dist ph [place]  NI         Tonometry (Tonopen, 7:53 AM)       Right Left   Pressure 11 11         Pupils       Pupils Dark Light Shape React APD   Right PERRL 3 2 Round Brisk None   Left PERRL 3 2 Round Brisk None         Visual Fields (Counting fingers)       Left Right     Full Full         Extraocular Movement       Right Left    Full, Ortho Full, Ortho         Neuro/Psych     Oriented x3: Yes   Mood/Affect: Normal         Dilation     Both eyes: 1.0% Mydriacyl, 2.5% Phenylephrine @ 7:54 AM           Slit Lamp and Fundus Exam     Slit Lamp Exam       Right Left   Lids/Lashes Dermato, mild MGD Dermato, mild MGD   Conjunctiva/Sclera White and quiet White and quiet   Cornea trace PEE, mild tear film debris, mild arcus 1+ inferior PEE, mild tear film debris   Anterior Chamber Deep and clear, narrow temporal angle Deep and clear, narrow temporal angle   Iris Round and dilated, no NVI Round and dilated, no NVI   Lens 2+ NS, 2-3+ cortical 2+ NS, 2-3+ cortical, 1+ Vacuoles   Anterior Vitreous Mild Synerisis, Posterior vitreous detachment Mild Synerisis         Fundus Exam       Right Left   Disc Pink, sharp, Mild PPP Pink, sharp, +cupping, mild PPP   C/D Ratio 0.55 0.65   Macula Flat, good foveal reflex, scattered punctate MA, mild cystic changes -- slightly improved Flat, good foveal reflex, scattered punctate MA, no frank edema   Vessels Attenuated, mild tortuosity, mild copper wiring Attenuated, mild tortuosity, mild copper wiring   Periphery Attached, rare MA Attached, rare scattered punctate MA greatest posteriorly; flat pigmented choroidal lesion along ST arcades -- ~1.5DD, no SRF orange pigment or drusen           Refraction     Manifest Refraction       Sphere Cylinder Axis Dist VA   Right       Left [place] +0.75 010 20/25           IMAGING AND PROCEDURES  Imaging and Procedures for 10/09/2022  OCT, Retina - OU - Both Eyes       Right Eye Quality was good. Central Foveal Thickness: 260. Progression has improved. Findings include normal foveal contour, no IRF, no SRF, intraretinal hyper-reflective material (Interval improvement in focal IRF / cystic changes IT fovea).   Left Eye Quality was good.  Central Foveal Thickness: 243. Progression has been stable. Findings include normal foveal contour, no IRF, no SRF, intraretinal  hyper-reflective material (Focal cyst temporal fovea, focal choroidal hyper reflective lesion along ST arcades -- stable from prior).   Notes *Images captured and stored on drive  Diagnosis / Impression:  OD: Interval improvement in IRF / cystic changes IT fovea OS: Focal cyst temporal fovea, focal choroidal hyper reflective lesion along ST arcades -- stable from prior  Clinical management:  See below  Abbreviations: NFP - Normal foveal profile. CME - cystoid macular edema. PED - pigment epithelial detachment. IRF - intraretinal fluid. SRF - subretinal fluid. EZ - ellipsoid zone. ERM - epiretinal membrane. ORA - outer retinal atrophy. ORT - outer retinal tubulation. SRHM - subretinal hyper-reflective material. IRHM - intraretinal hyper-reflective material             ASSESSMENT/PLAN:    ICD-10-CM   1. Moderate nonproliferative diabetic retinopathy of both eyes with macular edema associated with type 2 diabetes mellitus (HCC)  E11.3313 OCT, Retina - OU - Both Eyes    2. Long term (current) use of oral hypoglycemic drugs  Z79.84     3. Essential hypertension  I10     4. Hypertensive retinopathy of both eyes  H35.033     5. Nevus of choroid of left eye  D31.32     6. Combined forms of age-related cataract of both eyes  H25.813      1,2. Mod non-proliferative diabetic retinopathy w/ DME OU  - most recent A1c 9.1in 3.28.23.  - exam shows scattered punctate MA OU - OCT shows OD: Interval improvement in IRF / cystic changes IT fovea; OS: Trace cystic changes temporal fovea - FA (1.31.23) shows OU: Mild scattered MA w/mild late leakage. No NV OU - f/u in 9 mos -- DFE/OCT - pts PCP is Randalyn Rhea, FNP at [facility] (Fax [phone])  3,4. Hypertensive retinopathy OU - discussed importance of tight BP control - monitor  5. Choroidal Nevus,  OS  - flat pigmented lesion along ST arcades -- ~1.5DD  - no visual symptoms, SRF or orange pigment  - no drusen  - thickness < 2mm  - discussed findings, prognosis  - recommend monitoring  - f/u 9 mos  6. Mixed Cataract OU - The symptoms of cataract, surgical options, and treatments and risks were discussed with patient. - discussed diagnosis and progression - monitor  Ophthalmic Meds Ordered this visit:  No orders of the defined types were placed in this encounter.    Return in about 9 months (around 07/10/2023) for f/u NPDR OU, DFE, OCT.  There are no Patient Instructions on file for this visit.  Explained the diagnoses, plan, and follow up with the patient and they expressed understanding.  Patient expressed understanding of the importance of proper follow up care.   This document serves as a record of services personally performed by Karie Chimera, MD, PhD. It was created on their behalf by Glee Arvin. Manson Passey, OA an ophthalmic technician. The creation of this record is the provider's dictation and/or activities during the visit.    Electronically signed by: Glee Arvin. Manson Passey, [place] 05.16.2024 5:16 PM  Karie Chimera, M.D., Ph.D. Diseases & Surgery of the Retina and Vitreous [facility]  I have reviewed the above documentation for accuracy and completeness, and I agree with the above. Karie Chimera, M.D., Ph.D. 10/09/22 5:18 PM  Abbreviations: M myopia (nearsighted); A astigmatism; H hyperopia (farsighted); P presbyopia; Mrx spectacle prescription;  CTL contact lenses; OD right eye; OS left eye; OU both  eyes  XT exotropia; ET esotropia; PEK punctate epithelial keratitis; PEE punctate epithelial erosions; DES dry eye syndrome; MGD meibomian gland dysfunction; ATs artificial tears; PFAT's preservative free artificial tears; NSC nuclear sclerotic cataract; PSC posterior subcapsular cataract; ERM epi-retinal membrane; PVD posterior vitreous detachment; RD retinal  detachment; DM diabetes mellitus; DR diabetic retinopathy; NPDR non-proliferative diabetic retinopathy; PDR proliferative diabetic retinopathy; CSME clinically significant macular edema; DME diabetic macular edema; dbh dot blot hemorrhages; CWS cotton wool spot; POAG primary open angle glaucoma; C/D cup-to-disc ratio; HVF humphrey visual field; GVF goldmann visual field; OCT optical coherence tomography; IOP intraocular pressure; BRVO Branch retinal vein occlusion; CRVO central retinal vein occlusion; CRAO central retinal artery occlusion; BRAO branch retinal artery occlusion; RT retinal tear; SB scleral buckle; PPV pars plana vitrectomy; VH Vitreous hemorrhage; PRP panretinal laser photocoagulation; IVK intravitreal kenalog; VMT vitreomacular traction; MH Macular hole;  NVD neovascularization of the disc; NVE neovascularization elsewhere; AREDS age related eye disease study; ARMD age related macular degeneration; POAG primary open angle glaucoma; EBMD epithelial/anterior basement membrane dystrophy; ACIOL anterior chamber intraocular lens; IOL intraocular lens; PCIOL posterior chamber intraocular lens; Phaco/IOL phacoemulsification with intraocular lens placement; PRK photorefractive keratectomy; LASIK laser assisted in situ keratomileusis; HTN hypertension; DM diabetes mellitus; COPD chronic obstructive pulmonary disease

## 2022-10-09 ENCOUNTER — Encounter (INDEPENDENT_AMBULATORY_CARE_PROVIDER_SITE_OTHER): Payer: Self-pay | Admitting: Ophthalmology

## 2022-10-09 ENCOUNTER — Ambulatory Visit (INDEPENDENT_AMBULATORY_CARE_PROVIDER_SITE_OTHER): Payer: Medicare HMO | Admitting: Ophthalmology

## 2022-10-09 DIAGNOSIS — H25813 Combined forms of age-related cataract, bilateral: Secondary | ICD-10-CM

## 2022-10-09 DIAGNOSIS — H35033 Hypertensive retinopathy, bilateral: Secondary | ICD-10-CM

## 2022-10-09 DIAGNOSIS — E113313 Type 2 diabetes mellitus with moderate nonproliferative diabetic retinopathy with macular edema, bilateral: Secondary | ICD-10-CM

## 2022-10-09 DIAGNOSIS — D3132 Benign neoplasm of left choroid: Secondary | ICD-10-CM

## 2022-10-09 DIAGNOSIS — Z7984 Long term (current) use of oral hypoglycemic drugs: Secondary | ICD-10-CM | POA: Diagnosis not present

## 2022-10-09 DIAGNOSIS — I10 Essential (primary) hypertension: Secondary | ICD-10-CM | POA: Diagnosis not present

## 2022-10-16 IMAGING — CR DG HIP (WITH OR WITHOUT PELVIS) 2-3V*L*
1 series · 1 of 1 positions shown · non-contrast
Comparison: None.

CLINICAL DATA: Left hip pain

EXAM:
DG HIP (WITH OR WITHOUT PELVIS) 2-3V LEFT

[w pelvis *]
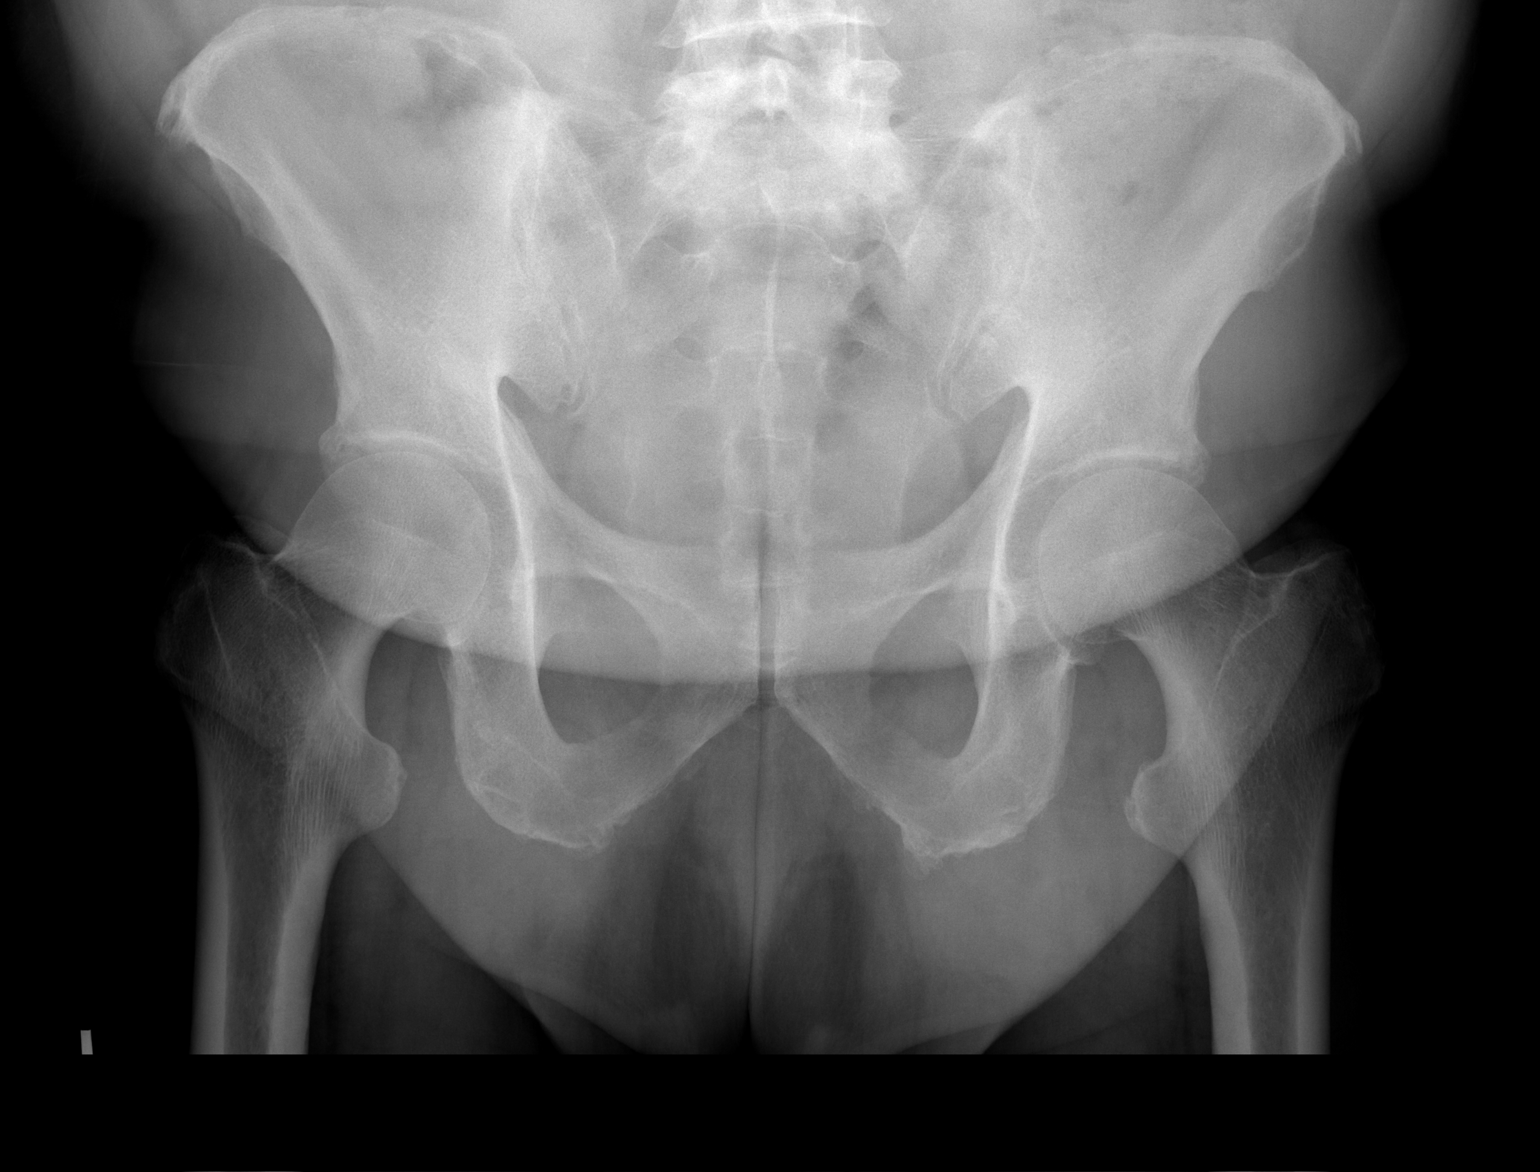

[1 of 1 positions shown; findings below may reference images not displayed]

FINDINGS: Frontal view of the pelvis as well as frontal and frogleg lateral
views of the left hip are obtained. No fracture, subluxation, or
dislocation. Mild osteoarthritis of the bilateral hips. Sacroiliac
joints are normal.
IMPRESSION: 1. Mild symmetrical bilateral hip osteoarthritis. No acute bony
abnormality.

## 2023-07-09 NOTE — Progress Notes (Signed)
 [facility] - Clinic Note  07/10/2023     CHIEF COMPLAINT Patient presents for Retina Follow Up  HISTORY OF PRESENT ILLNESS: Reginald Abbott is a 68 y.o. male who presents to the clinic today for:   HPI     Retina Follow Up   Patient presents with  Other.  In both eyes.  This started 9 months ago.  I, the attending physician,  performed the HPI with the patient and updated documentation appropriately.        Comments   Patient here for 9 months retina follow up for NPDR OU. Patient states vision doing fine. No eye pain.       Last edited by Rennis Chris, MD on 07/10/2023  8:26 AM.    Pt states he is on metformin and started taking insulin in November bc his blood sugar was high, he states his vision feels fine  Referring physician: Verlon Au, MD [phone] [address] [place],  [place] [address]  HISTORICAL INFORMATION:  Selected notes from the MEDICAL RECORD NUMBER Referred by Dr. Roxan Hockey for DM eval   CURRENT MEDICATIONS: No current outpatient medications on file. (Ophthalmic Drugs)   No current facility-administered medications for this visit. (Ophthalmic Drugs)   Current Outpatient Medications (Other)  Medication Sig   fenofibrate 160 MG tablet Take 160 mg by mouth daily.   HYDROcodone-acetaminophen (NORCO) 5-325 MG per tablet Take 1 tablet by mouth every 6 (six) hours as needed for moderate pain.   lisinopril (PRINIVIL,ZESTRIL) 10 MG tablet Take 10 mg by mouth daily.   meloxicam (MOBIC) 7.5 MG tablet Take 1 tablet (7.5 mg total) by mouth 2 (two) times daily as needed for pain.   metFORMIN (GLUCOPHAGE) 850 MG tablet Take 850 mg by mouth 2 (two) times daily with a meal.   naproxen (NAPROSYN) 500 MG tablet Take 1 tablet (500 mg total) by mouth 2 (two) times daily with a meal. As needed for pain   valACYclovir (VALTREX) 1000 MG tablet Take 1 tablet (1,000 mg total) by mouth 3 (three) times daily.   cephALEXin (KEFLEX) 250 MG capsule Take 1  capsule (250 mg total) by mouth 4 (four) times daily. (Patient not taking: Reported on 07/10/2023)   No current facility-administered medications for this visit. (Other)   REVIEW OF SYSTEMS: ROS   Positive for: Endocrine, Cardiovascular Negative for: Constitutional, Gastrointestinal, Neurological, Skin, Genitourinary, Musculoskeletal, HENT, Eyes, Respiratory, Psychiatric, Allergic/Imm, Heme/Lymph Last edited by Laddie Aquas, COA on 07/10/2023  7:59 AM.     ALLERGIES No Known Allergies  PAST MEDICAL HISTORY Past Medical History:  Diagnosis Date   At risk for sleep apnea    STOP-BANG= 5     SENT TO PCP 12-21-2013   Bilateral hydrocele    Dyslipidemia    Hypertension    Type 2 diabetes mellitus (HCC)    Past Surgical History:  Procedure Laterality Date   HYDROCELE EXCISION Bilateral 12/23/2013   Procedure: HYDROCELECTOMY ADULT;  Surgeon: Danae Chen, MD;  Location: [facility];  Service: Urology;  Laterality: Bilateral;   FAMILY HISTORY History reviewed. No pertinent family history.  SOCIAL HISTORY Social History   Tobacco Use   Smoking status: Every Day    Current packs/day: 0.25    Average packs/day: 0.3 packs/day for 20.0 years (5.0 ttl pk-yrs)    Types: Cigarettes   Smokeless tobacco: Never  Vaping Use   Vaping status: Never Used  Substance Use Topics   Alcohol use:  Yes    Comment: OCCASIONAL BEER   Drug use: No       OPHTHALMIC EXAM:  Base Eye Exam     Visual Acuity (Snellen - Linear)       Right Left   Dist [place] 20/20 20/20         Tonometry (Tonopen, 7:58 AM)       Right Left   Pressure 15 17         Pupils       Dark Light Shape React APD   Right 3 2 Round Brisk None   Left 3 2 Round Brisk None         Visual Fields (Counting fingers)       Left Right    Full Full         Extraocular Movement       Right Left    Full, Ortho Full, Ortho         Neuro/Psych     Oriented x3: Yes   Mood/Affect: Normal          Dilation     Both eyes: 1.0% Mydriacyl, 2.5% Phenylephrine @ 7:58 AM           Slit Lamp and Fundus Exam     Slit Lamp Exam       Right Left   Lids/Lashes Dermato Dermato   Conjunctiva/Sclera White and quiet White and quiet   Cornea arcus tear film debris   Anterior Chamber Deep and clear, narrow angles Deep and clear, narrow angles   Iris Round and dilated, no NVI Round and dilated, no NVI   Lens 2+ NS, 2-3+ cortical 2+ NS, 2-3+ cortical, 1+ Vacuoles   Anterior Vitreous Mild Synerisis, Posterior vitreous detachment Mild Synerisis         Fundus Exam       Right Left   Disc Pink, sharp, Mild PPP Pink, sharp, +cupping, mild PPP   C/D Ratio 0.6 0.65   Macula Flat, good foveal reflex, scattered punctate MA, mild cystic changes temporal fovea Flat, good foveal reflex, RPE mottling, rare MA, no frank edema   Vessels Attenuated, mild tortuosity Attenuated, mild tortuosity   Periphery Attached, mild MA / DBH Attached, rare scattered punctate MA greatest posteriorly; flat pigmented choroidal lesion along ST arcades -- ~1.5DD, no SRF orange pigment or drusen           IMAGING AND PROCEDURES  Imaging and Procedures for 07/10/2023  OCT, Retina - OU - Both Eyes       Right Eye Quality was good. Central Foveal Thickness: 279. Progression has worsened. Findings include no SRF, abnormal foveal contour, intraretinal hyper-reflective material, intraretinal fluid (Interval increase in focal IRF / cystic changes IT fovea).   Left Eye Quality was good. Central Foveal Thickness: 240. Progression has been stable. Findings include normal foveal contour, no IRF, no SRF, intraretinal hyper-reflective material (Focal cyst temporal fovea -- improved, focal choroidal hyper reflective lesion along ST arcades -- stable from prior).   Notes *Images captured and stored on drive  Diagnosis / Impression:  OD: Interval increase in IRF / cystic changes IT fovea OS: Focal cyst temporal  fovea -- improved, focal choroidal hyper reflective lesion along ST arcades -- stable from prior  Clinical management:  See below  Abbreviations: NFP - Normal foveal profile. CME - cystoid macular edema. PED - pigment epithelial detachment. IRF - intraretinal fluid. SRF - subretinal fluid. EZ - ellipsoid zone. ERM - epiretinal membrane. ORA -  outer retinal atrophy. ORT - outer retinal tubulation. SRHM - subretinal hyper-reflective material. IRHM - intraretinal hyper-reflective material            ASSESSMENT/PLAN:    ICD-10-CM   1. Moderate nonproliferative diabetic retinopathy of both eyes with macular edema associated with type 2 diabetes mellitus (HCC)  E11.3313 OCT, Retina - OU - Both Eyes    2. Long term (current) use of oral hypoglycemic drugs  Z79.84     3. Essential hypertension  I10     4. Hypertensive retinopathy of both eyes  H35.033     5. Nevus of choroid of left eye  D31.32     6. Combined forms of age-related cataract of both eyes  H25.813      1,2. Mod non-proliferative diabetic retinopathy w/ DME OU  - most recent A1c 9.1in 3.28.23.  - exam shows scattered punctate MA OU - FA (1.31.23) shows OU: Mild scattered MA w/mild late leakage. No NV OU - OCT shows OD: Interval increase in IRF / cystic changes IT fovea; OS: focal cyst temporal fovea -- improved - f/u in 3 mos -- DFE/OCT - pts PCP is Randalyn Rhea, FNP at [facility] (Fax [phone])  3,4. Hypertensive retinopathy OU - discussed importance of tight BP control - monitor  5. Choroidal Nevus, OS  - flat pigmented lesion along ST arcades -- ~1.5DD  - no visual symptoms, SRF or orange pigment  - no drusen  - thickness < 2mm  - discussed findings, prognosis  - recommend monitoring  - f/u 9 mos  6. Mixed Cataract OU - The symptoms of cataract, surgical options, and treatments and risks were discussed with patient. - discussed diagnosis and progression - monitor  Ophthalmic Meds Ordered  this visit:  No orders of the defined types were placed in this encounter.    Return in about 3 months (around 10/07/2023) for f/u NPDR OU, DFE, OCT.  There are no Patient Instructions on file for this visit.  This document serves as a record of services personally performed by Karie Chimera, MD, PhD. It was created on their behalf by Berlin Hun COT, an ophthalmic technician. The creation of this record is the provider's dictation and/or activities during the visit.    Electronically signed by: Berlin Hun COT 02.27.25 8:27 AM  This document serves as a record of services personally performed by Karie Chimera, MD, PhD. It was created on their behalf by Glee Arvin. Manson Passey, OA an ophthalmic technician. The creation of this record is the provider's dictation and/or activities during the visit.    Electronically signed by: Glee Arvin. Manson Passey, OA 07/10/23 8:27 AM  Karie Chimera, M.D., Ph.D. Diseases & Surgery of the Retina and Vitreous [facility] 07/10/2023   I have reviewed the above documentation for accuracy and completeness, and I agree with the above. Karie Chimera, M.D., Ph.D. 07/10/23 8:28 AM   Abbreviations: M myopia (nearsighted); A astigmatism; H hyperopia (farsighted); P presbyopia; Mrx spectacle prescription;  CTL contact lenses; OD right eye; OS left eye; OU both eyes  XT exotropia; ET esotropia; PEK punctate epithelial keratitis; PEE punctate epithelial erosions; DES dry eye syndrome; MGD meibomian gland dysfunction; ATs artificial tears; PFAT's preservative free artificial tears; NSC nuclear sclerotic cataract; PSC posterior subcapsular cataract; ERM epi-retinal membrane; PVD posterior vitreous detachment; RD retinal detachment; DM diabetes mellitus; DR diabetic retinopathy; NPDR non-proliferative diabetic retinopathy; PDR proliferative diabetic retinopathy; CSME clinically significant macular edema; DME diabetic  macular edema; dbh dot blot  hemorrhages; CWS cotton wool spot; POAG primary open angle glaucoma; C/D cup-to-disc ratio; HVF humphrey visual field; GVF goldmann visual field; OCT optical coherence tomography; IOP intraocular pressure; BRVO Branch retinal vein occlusion; CRVO central retinal vein occlusion; CRAO central retinal artery occlusion; BRAO branch retinal artery occlusion; RT retinal tear; SB scleral buckle; PPV pars plana vitrectomy; VH Vitreous hemorrhage; PRP panretinal laser photocoagulation; IVK intravitreal kenalog; VMT vitreomacular traction; MH Macular hole;  NVD neovascularization of the disc; NVE neovascularization elsewhere; AREDS age related eye disease study; ARMD age related macular degeneration; POAG primary open angle glaucoma; EBMD epithelial/anterior basement membrane dystrophy; ACIOL anterior chamber intraocular lens; IOL intraocular lens; PCIOL posterior chamber intraocular lens; Phaco/IOL phacoemulsification with intraocular lens placement; PRK photorefractive keratectomy; LASIK laser assisted in situ keratomileusis; HTN hypertension; DM diabetes mellitus; COPD chronic obstructive pulmonary disease

## 2023-07-10 ENCOUNTER — Encounter (INDEPENDENT_AMBULATORY_CARE_PROVIDER_SITE_OTHER): Payer: Self-pay | Admitting: Ophthalmology

## 2023-07-10 ENCOUNTER — Ambulatory Visit (INDEPENDENT_AMBULATORY_CARE_PROVIDER_SITE_OTHER): Payer: 59 | Admitting: Ophthalmology

## 2023-07-10 DIAGNOSIS — Z7984 Long term (current) use of oral hypoglycemic drugs: Secondary | ICD-10-CM

## 2023-07-10 DIAGNOSIS — E113313 Type 2 diabetes mellitus with moderate nonproliferative diabetic retinopathy with macular edema, bilateral: Secondary | ICD-10-CM | POA: Diagnosis not present

## 2023-07-10 DIAGNOSIS — H35033 Hypertensive retinopathy, bilateral: Secondary | ICD-10-CM | POA: Diagnosis not present

## 2023-07-10 DIAGNOSIS — I10 Essential (primary) hypertension: Secondary | ICD-10-CM | POA: Diagnosis not present

## 2023-07-10 DIAGNOSIS — D3132 Benign neoplasm of left choroid: Secondary | ICD-10-CM

## 2023-07-10 DIAGNOSIS — H25813 Combined forms of age-related cataract, bilateral: Secondary | ICD-10-CM

## 2023-10-08 NOTE — Progress Notes (Signed)
 [facility] - Clinic Note  10/09/2023     CHIEF COMPLAINT Patient presents for Retina Follow Up  HISTORY OF PRESENT ILLNESS: Reginald Abbott is a 68 y.o. male who presents to the clinic today for:   HPI     Retina Follow Up   Patient presents with  Diabetic Retinopathy.  In both eyes.  This started 3 years ago.  Duration of 3 months.  Since onset it is stable.  I, the attending physician,  performed the HPI with the patient and updated documentation appropriately.        Comments   Pt presents for 3 month retina f/u, NPDR OU. Pt states no changes in vision. Pt denies FOL/floaters/discomfort. Pt does not remember his A1c but has an appt in July to update. Pt does not measure his BS and has been waiting to get all the parts for a arm monitor.       Last edited by Ronelle Coffee, MD on 10/09/2023 12:54 PM.    Pt states vision is doing well, he is unsure about his blood sugar, but has an appt in July to have it checked   Referring physician: Cherylin Corrigan, NP [address] [place],  [place] [address]  HISTORICAL INFORMATION:  Selected notes from the MEDICAL RECORD NUMBER Referred by Dr. Verdia Glad for DM eval   CURRENT MEDICATIONS: No current outpatient medications on file. (Ophthalmic Drugs)   No current facility-administered medications for this visit. (Ophthalmic Drugs)   Current Outpatient Medications (Other)  Medication Sig   fenofibrate 160 MG tablet Take 160 mg by mouth daily.   HYDROcodone -acetaminophen  (NORCO) 5-325 MG per tablet Take 1 tablet by mouth every 6 (six) hours as needed for moderate pain.   lisinopril (PRINIVIL,ZESTRIL) 10 MG tablet Take 10 mg by mouth daily.   meloxicam  (MOBIC ) 7.5 MG tablet Take 1 tablet (7.5 mg total) by mouth 2 (two) times daily as needed for pain.   metFORMIN (GLUCOPHAGE) 850 MG tablet Take 850 mg by mouth 2 (two) times daily with a meal.   naproxen  (NAPROSYN ) 500 MG tablet Take 1 tablet (500 mg  total) by mouth 2 (two) times daily with a meal. As needed for pain   valACYclovir  (VALTREX ) 1000 MG tablet Take 1 tablet (1,000 mg total) by mouth 3 (three) times daily.   cephALEXin  (KEFLEX ) 250 MG capsule Take 1 capsule (250 mg total) by mouth 4 (four) times daily. (Patient not taking: Reported on 10/09/2022)   No current facility-administered medications for this visit. (Other)   REVIEW OF SYSTEMS: ROS   Positive for: Endocrine, Cardiovascular Negative for: Constitutional, Gastrointestinal, Neurological, Skin, Genitourinary, Musculoskeletal, HENT, Eyes, Respiratory, Psychiatric, Allergic/Imm, Heme/Lymph Last edited by Carrington Clack, COT on 10/09/2023  8:14 AM.      ALLERGIES No Known Allergies  PAST MEDICAL HISTORY Past Medical History:  Diagnosis Date   At risk for sleep apnea    STOP-BANG= 5     SENT TO PCP 12-21-2013   Bilateral hydrocele    Dyslipidemia    Hypertension    Type 2 diabetes mellitus (HCC)    Past Surgical History:  Procedure Laterality Date   HYDROCELE EXCISION Bilateral 12/23/2013   Procedure: HYDROCELECTOMY ADULT;  Surgeon: Arleen Bells, MD;  Location: [facility];  Service: Urology;  Laterality: Bilateral;   FAMILY HISTORY History reviewed. No pertinent family history.  SOCIAL HISTORY Social History   Tobacco Use   Smoking status: Every Day  Current packs/day: 0.25    Average packs/day: 0.3 packs/day for 20.0 years (5.0 ttl pk-yrs)    Types: Cigarettes   Smokeless tobacco: Never  Vaping Use   Vaping status: Never Used  Substance Use Topics   Alcohol use: Yes    Comment: OCCASIONAL BEER   Drug use: No       OPHTHALMIC EXAM:  Base Eye Exam     Visual Acuity (Snellen - Linear)       Right Left   Dist cc 20/20 -1 20/20 -1         Tonometry (Tonopen, 8:09 AM)       Right Left   Pressure 17 16         Pupils       Pupils Dark Light Shape React APD   Right PERRL 3 2 Round Brisk None   Left PERRL 3 2 Round  Brisk None         Visual Fields       Left Right    Full Full         Extraocular Movement       Right Left    Full, Ortho Full, Ortho         Neuro/Psych     Oriented x3: Yes   Mood/Affect: Normal         Dilation     Both eyes: 1.0% Mydriacyl, 2.5% Phenylephrine @ 8:09 AM           Slit Lamp and Fundus Exam     Slit Lamp Exam       Right Left   Lids/Lashes Dermato Dermato   Conjunctiva/Sclera White and quiet White and quiet   Cornea arcus, tear film debris, trace PEE tear film debris, 1+ Punctate epithelial erosions   Anterior Chamber Deep and clear, narrow angles Deep and clear, narrow angles   Iris Round and dilated, no NVI Round and dilated, no NVI   Lens 2+ NS, 2-3+ cortical 2+ NS, 2-3+ cortical, 1+ Vacuoles   Anterior Vitreous Mild Synerisis, Posterior vitreous detachment Mild Synerisis         Fundus Exam       Right Left   Disc Pink, sharp, Mild PPP Pink, sharp, +cupping, mild PPP   C/D Ratio 0.6 0.65   Macula Flat, good foveal reflex, scattered punctate MA, mild cystic changes temporal fovea Flat, good foveal reflex, RPE mottling, rare MA, no frank edema   Vessels Attenuated, mild tortuosity Attenuated, mild tortuosity   Periphery Attached, rare MA / DBH Attached, rare scattered punctate MA; flat pigmented choroidal lesion along ST arcades -- ~1.5DD, no SRF orange pigment or drusen           IMAGING AND PROCEDURES  Imaging and Procedures for 10/09/2023  OCT, Retina - OU - Both Eyes        Right Eye Quality was good. Central Foveal Thickness: 284. Progression has improved. Findings include no SRF, abnormal foveal contour, intraretinal hyper-reflective material, epiretinal membrane, intraretinal fluid (Persistent IRF / cystic changes IT fovea -- slightly improved).   Left Eye Quality was good. Central Foveal Thickness: 237. Progression has been stable. Findings include normal foveal contour, no IRF, no SRF (Focal cyst temporal  fovea -- stably improved, focal choroidal hyper reflective lesion along ST arcades -- stable from prior).   Notes  *Images captured and stored on drive  Diagnosis / Impression:  OD: Persistent IRF / cystic changes IT fovea -- slightly improved OS: Focal cyst  temporal fovea -- stably improved, focal choroidal hyper reflective lesion along ST arcades -- stable from prior  Clinical management:  See below  Abbreviations: NFP - Normal foveal profile. CME - cystoid macular edema. PED - pigment epithelial detachment. IRF - intraretinal fluid. SRF - subretinal fluid. EZ - ellipsoid zone. ERM - epiretinal membrane. ORA - outer retinal atrophy. ORT - outer retinal tubulation. SRHM - subretinal hyper-reflective material. IRHM - intraretinal hyper-reflective material            ASSESSMENT/PLAN:    ICD-10-CM   1. Moderate nonproliferative diabetic retinopathy of both eyes with macular edema associated with type 2 diabetes mellitus (HCC)  E11.3313 OCT, Retina - OU - Both Eyes    2. Long term (current) use of oral hypoglycemic drugs  Z79.84     3. Essential hypertension  I10     4. Hypertensive retinopathy of both eyes  H35.033     5. Nevus of choroid of left eye  D31.32     6. Combined forms of age-related cataract of both eyes  H25.813       1,2. Mod non-proliferative diabetic retinopathy w/ DME OU  - most recent A1c 7.0 on 04.11.25 - exam shows scattered punctate MA OU - FA (1.31.23) shows OU: Mild scattered MA w/mild late leakage. No NV OU - OCT shows OD: Persistent IRF / cystic changes IT fovea -- slightly improved; OS: Focal cyst temporal fovea -- stably improved, focal choroidal hyper reflective lesion along ST arcades -- stable from prior  - f/u in 4-6 mos -- DFE/OCT - pts PCP is Marthe Slain, FNP at [facility] (Fax [phone])  3,4. Hypertensive retinopathy OU - discussed importance of tight BP control - monitor  5. Choroidal Nevus, OS  - flat pigmented lesion  along ST arcades -- ~1.5DD  - no visual symptoms, SRF or orange pigment  - no drusen  - thickness < 2mm  - discussed findings, prognosis  - recommend monitoring  - monitor  6. Mixed Cataract OU - The symptoms of cataract, surgical options, and treatments and risks were discussed with patient. - discussed diagnosis and progression - monitor  Ophthalmic Meds Ordered this visit:  No orders of the defined types were placed in this encounter.    Return for f/u 4-6 months, NPDR OU, DFE, OCT.  There are no Patient Instructions on file for this visit.  This document serves as a record of services personally performed by Jeanice Millard, MD, PhD. It was created on their behalf by Eller Gut COT, an ophthalmic technician. The creation of this record is the provider's dictation and/or activities during the visit.    Electronically signed by: Eller Gut COT 05.29.25  1:00 PM  This document serves as a record of services personally performed by Jeanice Millard, MD, PhD. It was created on their behalf by Morley Arabia. Bevin Bucks, OA an ophthalmic technician. The creation of this record is the provider's dictation and/or activities during the visit.    Electronically signed by: Morley Arabia. Bevin Bucks, OA 10/09/23 1:00 PM  Jeanice Millard, M.D., Ph.D. Diseases & Surgery of the Retina and Vitreous [facility] 10/09/2023   I have reviewed the above documentation for accuracy and completeness, and I agree with the above. Jeanice Millard, M.D., Ph.D. 10/09/23 1:00 PM   Abbreviations: M myopia (nearsighted); A astigmatism; H hyperopia (farsighted); P presbyopia; Mrx spectacle prescription;  CTL contact lenses; OD right eye; OS left eye; OU both  eyes  XT exotropia; ET esotropia; PEK punctate epithelial keratitis; PEE punctate epithelial erosions; DES dry eye syndrome; MGD meibomian gland dysfunction; ATs artificial tears; PFAT's preservative free artificial tears; NSC nuclear  sclerotic cataract; PSC posterior subcapsular cataract; ERM epi-retinal membrane; PVD posterior vitreous detachment; RD retinal detachment; DM diabetes mellitus; DR diabetic retinopathy; NPDR non-proliferative diabetic retinopathy; PDR proliferative diabetic retinopathy; CSME clinically significant macular edema; DME diabetic macular edema; dbh dot blot hemorrhages; CWS cotton wool spot; POAG primary open angle glaucoma; C/D cup-to-disc ratio; HVF humphrey visual field; GVF goldmann visual field; OCT optical coherence tomography; IOP intraocular pressure; BRVO Branch retinal vein occlusion; CRVO central retinal vein occlusion; CRAO central retinal artery occlusion; BRAO branch retinal artery occlusion; RT retinal tear; SB scleral buckle; PPV pars plana vitrectomy; VH Vitreous hemorrhage; PRP panretinal laser photocoagulation; IVK intravitreal kenalog; VMT vitreomacular traction; MH Macular hole;  NVD neovascularization of the disc; NVE neovascularization elsewhere; AREDS age related eye disease study; ARMD age related macular degeneration; POAG primary open angle glaucoma; EBMD epithelial/anterior basement membrane dystrophy; ACIOL anterior chamber intraocular lens; IOL intraocular lens; PCIOL posterior chamber intraocular lens; Phaco/IOL phacoemulsification with intraocular lens placement; PRK photorefractive keratectomy; LASIK laser assisted in situ keratomileusis; HTN hypertension; DM diabetes mellitus; COPD chronic obstructive pulmonary disease

## 2023-10-09 ENCOUNTER — Ambulatory Visit (INDEPENDENT_AMBULATORY_CARE_PROVIDER_SITE_OTHER): Payer: 59 | Admitting: Ophthalmology

## 2023-10-09 ENCOUNTER — Encounter (INDEPENDENT_AMBULATORY_CARE_PROVIDER_SITE_OTHER): Payer: Self-pay | Admitting: Ophthalmology

## 2023-10-09 DIAGNOSIS — Z7984 Long term (current) use of oral hypoglycemic drugs: Secondary | ICD-10-CM

## 2023-10-09 DIAGNOSIS — H25813 Combined forms of age-related cataract, bilateral: Secondary | ICD-10-CM

## 2023-10-09 DIAGNOSIS — D3132 Benign neoplasm of left choroid: Secondary | ICD-10-CM

## 2023-10-09 DIAGNOSIS — H35033 Hypertensive retinopathy, bilateral: Secondary | ICD-10-CM

## 2023-10-09 DIAGNOSIS — E113313 Type 2 diabetes mellitus with moderate nonproliferative diabetic retinopathy with macular edema, bilateral: Secondary | ICD-10-CM

## 2023-10-09 DIAGNOSIS — I10 Essential (primary) hypertension: Secondary | ICD-10-CM

## 2024-04-01 ENCOUNTER — Encounter (INDEPENDENT_AMBULATORY_CARE_PROVIDER_SITE_OTHER): Admitting: Ophthalmology
